# Patient Record
Sex: Male | Born: 1970 | Race: White | Hispanic: No | Smoking: Current some day smoker
Health system: Southern US, Community
[De-identification: ages and names within clinical notes are randomized; demographics above are authoritative.]

## PROBLEM LIST (undated history)

## (undated) DIAGNOSIS — R7989 Other specified abnormal findings of blood chemistry: Secondary | ICD-10-CM

## (undated) DIAGNOSIS — M549 Dorsalgia, unspecified: Secondary | ICD-10-CM

## (undated) DIAGNOSIS — I10 Essential (primary) hypertension: Secondary | ICD-10-CM

## (undated) DIAGNOSIS — F1123 Opioid dependence with withdrawal: Secondary | ICD-10-CM

## (undated) DIAGNOSIS — N200 Calculus of kidney: Secondary | ICD-10-CM

## (undated) DIAGNOSIS — F1193 Opioid use, unspecified with withdrawal: Secondary | ICD-10-CM

## (undated) DIAGNOSIS — S8290XA Unspecified fracture of unspecified lower leg, initial encounter for closed fracture: Secondary | ICD-10-CM

## (undated) DIAGNOSIS — F1120 Opioid dependence, uncomplicated: Secondary | ICD-10-CM

## (undated) HISTORY — DX: Other specified abnormal findings of blood chemistry: R79.89

## (undated) HISTORY — DX: Unspecified fracture of unspecified lower leg, initial encounter for closed fracture: S82.90XA

## (undated) HISTORY — DX: Opioid dependence with withdrawal: F11.23

## (undated) HISTORY — DX: Calculus of kidney: N20.0

## (undated) HISTORY — DX: Opioid dependence, uncomplicated: F11.20

## (undated) HISTORY — DX: Dorsalgia, unspecified: M54.9

## (undated) HISTORY — DX: Opioid use, unspecified with withdrawal: F11.93

## (undated) HISTORY — PX: HERNIA REPAIR: SHX51

---

## 2004-07-03 ENCOUNTER — Emergency Department: Payer: Self-pay | Admitting: Emergency Medicine

## 2006-04-07 ENCOUNTER — Emergency Department: Payer: Self-pay | Admitting: Emergency Medicine

## 2007-09-05 ENCOUNTER — Other Ambulatory Visit: Payer: Self-pay

## 2007-09-05 ENCOUNTER — Emergency Department: Payer: Self-pay | Admitting: Emergency Medicine

## 2007-10-06 ENCOUNTER — Ambulatory Visit: Payer: Self-pay | Admitting: Urology

## 2008-01-11 ENCOUNTER — Ambulatory Visit: Payer: Self-pay | Admitting: Gastroenterology

## 2008-02-02 ENCOUNTER — Ambulatory Visit: Payer: Self-pay | Admitting: Urology

## 2008-02-27 ENCOUNTER — Emergency Department: Payer: Self-pay | Admitting: Emergency Medicine

## 2008-03-07 ENCOUNTER — Encounter: Payer: Self-pay | Admitting: Family Medicine

## 2008-03-17 ENCOUNTER — Encounter: Payer: Self-pay | Admitting: Family Medicine

## 2008-04-11 ENCOUNTER — Ambulatory Visit: Payer: Self-pay | Admitting: Family Medicine

## 2008-07-14 ENCOUNTER — Emergency Department: Payer: Self-pay | Admitting: Unknown Physician Specialty

## 2008-10-06 ENCOUNTER — Emergency Department: Payer: Self-pay | Admitting: Unknown Physician Specialty

## 2008-10-08 ENCOUNTER — Emergency Department: Payer: Self-pay | Admitting: Emergency Medicine

## 2009-09-05 ENCOUNTER — Emergency Department: Payer: Self-pay | Admitting: Emergency Medicine

## 2009-11-13 ENCOUNTER — Ambulatory Visit: Payer: Self-pay | Admitting: Family Medicine

## 2010-03-05 ENCOUNTER — Emergency Department: Payer: Self-pay | Admitting: Unknown Physician Specialty

## 2011-09-25 ENCOUNTER — Emergency Department: Payer: Self-pay | Admitting: Emergency Medicine

## 2011-10-06 ENCOUNTER — Emergency Department: Payer: Self-pay | Admitting: Emergency Medicine

## 2011-10-06 LAB — CBC
MCH: 26.9 pg (ref 26.0–34.0)
Platelet: 263 10*3/uL (ref 150–440)
RBC: 4.86 10*6/uL (ref 4.40–5.90)
WBC: 8.9 10*3/uL (ref 3.8–10.6)

## 2011-10-06 LAB — URINALYSIS, COMPLETE
Bilirubin,UR: NEGATIVE
Glucose,UR: NEGATIVE mg/dL (ref 0–75)
Hyaline Cast: 1
Protein: NEGATIVE
Specific Gravity: 1.01 (ref 1.003–1.030)
WBC UR: 5 /HPF (ref 0–5)

## 2011-10-06 LAB — BASIC METABOLIC PANEL
Calcium, Total: 9.6 mg/dL (ref 8.5–10.1)
Chloride: 101 mmol/L (ref 98–107)
Co2: 28 mmol/L (ref 21–32)
Creatinine: 1.14 mg/dL (ref 0.60–1.30)
Sodium: 137 mmol/L (ref 136–145)

## 2011-10-25 ENCOUNTER — Emergency Department: Payer: Self-pay | Admitting: *Deleted

## 2011-10-25 LAB — COMPREHENSIVE METABOLIC PANEL
Albumin: 4.8 g/dL (ref 3.4–5.0)
Alkaline Phosphatase: 49 U/L — ABNORMAL LOW (ref 50–136)
Anion Gap: 10 (ref 7–16)
Glucose: 106 mg/dL — ABNORMAL HIGH (ref 65–99)
Potassium: 4 mmol/L (ref 3.5–5.1)
SGOT(AST): 21 U/L (ref 15–37)
SGPT (ALT): 21 U/L
Total Protein: 8.7 g/dL — ABNORMAL HIGH (ref 6.4–8.2)

## 2011-10-25 LAB — LIPASE, BLOOD: Lipase: 168 U/L (ref 73–393)

## 2011-10-25 LAB — URINALYSIS, COMPLETE
Bacteria: NONE SEEN
Blood: NEGATIVE
Ketone: NEGATIVE
Protein: NEGATIVE
Specific Gravity: 1.002 (ref 1.003–1.030)
WBC UR: NONE SEEN /HPF (ref 0–5)

## 2011-10-25 LAB — CBC
HCT: 43.8 % (ref 40.0–52.0)
HGB: 14.4 g/dL (ref 13.0–18.0)
RBC: 5.31 10*6/uL (ref 4.40–5.90)
WBC: 6.3 10*3/uL (ref 3.8–10.6)

## 2011-11-07 ENCOUNTER — Other Ambulatory Visit: Payer: Self-pay

## 2011-11-07 ENCOUNTER — Emergency Department (HOSPITAL_COMMUNITY): Payer: Self-pay

## 2011-11-07 ENCOUNTER — Emergency Department (HOSPITAL_COMMUNITY): Payer: No Typology Code available for payment source

## 2011-11-07 ENCOUNTER — Encounter (HOSPITAL_COMMUNITY): Payer: Self-pay | Admitting: *Deleted

## 2011-11-07 ENCOUNTER — Emergency Department (HOSPITAL_COMMUNITY): Payer: No Typology Code available for payment source | Admitting: Anesthesiology

## 2011-11-07 ENCOUNTER — Encounter (HOSPITAL_COMMUNITY): Payer: Self-pay | Admitting: Anesthesiology

## 2011-11-07 ENCOUNTER — Encounter (HOSPITAL_COMMUNITY)
Admission: EM | Disposition: A | Discharge status: Home / Self Care | Payer: Self-pay | Source: Home / Self Care | Attending: Orthopedic Surgery

## 2011-11-07 ENCOUNTER — Inpatient Hospital Stay (HOSPITAL_COMMUNITY)
Admission: EM | Admit: 2011-11-07 | Discharge: 2011-11-10 | DRG: 482 | Disposition: A | Discharge status: Home / Self Care | Payer: No Typology Code available for payment source | Attending: Orthopedic Surgery | Admitting: Orthopedic Surgery

## 2011-11-07 DIAGNOSIS — I10 Essential (primary) hypertension: Secondary | ICD-10-CM | POA: Diagnosis present

## 2011-11-07 DIAGNOSIS — S72309A Unspecified fracture of shaft of unspecified femur, initial encounter for closed fracture: Principal | ICD-10-CM | POA: Diagnosis present

## 2011-11-07 DIAGNOSIS — G8918 Other acute postprocedural pain: Secondary | ICD-10-CM | POA: Diagnosis not present

## 2011-11-07 DIAGNOSIS — S7290XA Unspecified fracture of unspecified femur, initial encounter for closed fracture: Secondary | ICD-10-CM

## 2011-11-07 DIAGNOSIS — K219 Gastro-esophageal reflux disease without esophagitis: Secondary | ICD-10-CM | POA: Diagnosis present

## 2011-11-07 DIAGNOSIS — Z01812 Encounter for preprocedural laboratory examination: Secondary | ICD-10-CM

## 2011-11-07 HISTORY — PX: FEMUR IM NAIL: SHX1597

## 2011-11-07 LAB — URINALYSIS, MICROSCOPIC ONLY
Ketones, ur: NEGATIVE mg/dL
Leukocytes, UA: NEGATIVE
Nitrite: NEGATIVE
Protein, ur: NEGATIVE mg/dL

## 2011-11-07 LAB — COMPREHENSIVE METABOLIC PANEL
ALT: 23 U/L (ref 0–53)
AST: 28 U/L (ref 0–37)
CO2: 25 mEq/L (ref 19–32)
Chloride: 102 mEq/L (ref 96–112)
Creatinine, Ser: 0.74 mg/dL (ref 0.50–1.35)
GFR calc Af Amer: >90 mL/min (ref >90)
GFR calc non Af Amer: >90 mL/min (ref >90)
Glucose, Bld: 99 mg/dL (ref 70–99)
Total Bilirubin: 0.2 mg/dL — ABNORMAL LOW (ref 0.3–1.2)

## 2011-11-07 LAB — POCT I-STAT, CHEM 8
Chloride: 106 mEq/L (ref 96–112)
Creatinine, Ser: 1.1 mg/dL (ref 0.50–1.35)
Glucose, Bld: 99 mg/dL (ref 70–99)
Hemoglobin: 15 g/dL (ref 13.0–17.0)
Potassium: 3.6 mEq/L (ref 3.5–5.1)

## 2011-11-07 LAB — CBC
MCH: 27.5 pg (ref 26.0–34.0)
MCHC: 33.3 g/dL (ref 30.0–36.0)
Platelets: 220 10*3/uL (ref 150–400)
RBC: 4.98 MIL/uL (ref 4.22–5.81)

## 2011-11-07 LAB — TYPE AND SCREEN: ABO/RH(D): B NEG

## 2011-11-07 LAB — ABO/RH: ABO/RH(D): B NEG

## 2011-11-07 LAB — RAPID URINE DRUG SCREEN, HOSP PERFORMED
Amphetamines: NOT DETECTED
Barbiturates: NOT DETECTED
Benzodiazepines: NOT DETECTED

## 2011-11-07 LAB — ETHANOL: Alcohol, Ethyl (B): 165 mg/dL — ABNORMAL HIGH (ref 0–11)

## 2011-11-07 SURGERY — INSERTION, INTRAMEDULLARY ROD, FEMUR
Anesthesia: General | Site: Thigh | Laterality: Left | Wound class: Clean

## 2011-11-07 MED ORDER — DEXTROSE 5 % IV SOLN
INTRAVENOUS | Status: DC | PRN
  Administered 2011-11-07: 05:00:00 via INTRAVENOUS

## 2011-11-07 MED ORDER — MENTHOL 3 MG MT LOZG
1.0000 | LOZENGE | OROMUCOSAL | Status: DC | PRN
  Filled 2011-11-07: qty 9

## 2011-11-07 MED ORDER — PHENOL 1.4 % MT LIQD: 1.0000 | OROMUCOSAL | Status: DC | PRN

## 2011-11-07 MED ORDER — QUINAPRIL HCL 10 MG PO TABS: 10.0000 mg | ORAL_TABLET | Freq: Two times a day (BID) | ORAL | Status: DC

## 2011-11-07 MED ORDER — METOCLOPRAMIDE HCL 10 MG PO TABS: 5.0000 mg | ORAL_TABLET | Freq: Three times a day (TID) | ORAL | Status: DC | PRN

## 2011-11-07 MED ORDER — 0.9 % SODIUM CHLORIDE (POUR BTL) OPTIME
TOPICAL | Status: DC | PRN
  Administered 2011-11-07: 1000 mL

## 2011-11-07 MED ORDER — ONDANSETRON HCL 4 MG/2ML IJ SOLN
4.0000 mg | Freq: Once | INTRAMUSCULAR | Status: AC
  Administered 2011-11-07: 4 mg via INTRAVENOUS
  Filled 2011-11-07: qty 2

## 2011-11-07 MED ORDER — CEFAZOLIN SODIUM 1-5 GM-% IV SOLN
INTRAVENOUS | Status: DC | PRN
  Administered 2011-11-07: 2 g via INTRAVENOUS

## 2011-11-07 MED ORDER — OXYCODONE HCL 5 MG PO TABS
10.0000 mg | ORAL_TABLET | ORAL | Status: DC | PRN
  Administered 2011-11-07: 10 mg via ORAL
  Filled 2011-11-07: qty 2

## 2011-11-07 MED ORDER — ACETAMINOPHEN 10 MG/ML IV SOLN
1000.0000 mg | Freq: Four times a day (QID) | INTRAVENOUS | Status: AC
  Administered 2011-11-07 – 2011-11-08 (×4): 1000 mg via INTRAVENOUS
  Filled 2011-11-07 (×4): qty 100

## 2011-11-07 MED ORDER — ONDANSETRON HCL 4 MG/2ML IJ SOLN: 4.0000 mg | Freq: Four times a day (QID) | INTRAMUSCULAR | Status: DC | PRN

## 2011-11-07 MED ORDER — DIPHENHYDRAMINE HCL 50 MG/ML IJ SOLN
12.5000 mg | Freq: Four times a day (QID) | INTRAMUSCULAR | Status: DC | PRN
  Administered 2011-11-07: 12.5 mg via INTRAVENOUS
  Filled 2011-11-07: qty 1

## 2011-11-07 MED ORDER — LISINOPRIL 10 MG PO TABS
10.0000 mg | ORAL_TABLET | Freq: Every day | ORAL | Status: DC
  Administered 2011-11-07 – 2011-11-09 (×3): 10 mg via ORAL
  Filled 2011-11-07 (×4): qty 1

## 2011-11-07 MED ORDER — CEFAZOLIN SODIUM 1-5 GM-% IV SOLN
INTRAVENOUS | Status: AC
  Filled 2011-11-07: qty 100

## 2011-11-07 MED ORDER — LACTATED RINGERS IV SOLN
INTRAVENOUS | Status: DC | PRN
  Administered 2011-11-07: 07:00:00 via INTRAVENOUS

## 2011-11-07 MED ORDER — SODIUM CHLORIDE 0.9 % IV SOLN
INTRAVENOUS | Status: DC | PRN
  Administered 2011-11-07: 05:00:00 via INTRAVENOUS

## 2011-11-07 MED ORDER — HYDROMORPHONE 0.3 MG/ML IV SOLN
INTRAVENOUS | Status: DC
  Administered 2011-11-07: 12:00:00 via INTRAVENOUS
  Administered 2011-11-07: 3.59 mg via INTRAVENOUS
  Administered 2011-11-07: 2.99 mg via INTRAVENOUS
  Administered 2011-11-08: 0.999 mg via INTRAVENOUS
  Administered 2011-11-08: 1.99 mg via INTRAVENOUS
  Administered 2011-11-08: 1.33 mg via INTRAVENOUS
  Administered 2011-11-08: 3.39 mg via INTRAVENOUS
  Administered 2011-11-08: 2.99 mg via INTRAVENOUS
  Administered 2011-11-08 – 2011-11-09 (×2): 3.59 mg via INTRAVENOUS
  Administered 2011-11-09: 3.19 mg via INTRAVENOUS

## 2011-11-07 MED ORDER — ENOXAPARIN SODIUM 40 MG/0.4ML ~~LOC~~ SOLN
40.0000 mg | SUBCUTANEOUS | Status: DC
  Administered 2011-11-08 – 2011-11-10 (×3): 40 mg via SUBCUTANEOUS
  Filled 2011-11-07 (×4): qty 0.4

## 2011-11-07 MED ORDER — PROPOFOL 10 MG/ML IV EMUL
INTRAVENOUS | Status: DC | PRN
  Administered 2011-11-07: 200 mg via INTRAVENOUS

## 2011-11-07 MED ORDER — OXYCODONE HCL 5 MG PO TABS
10.0000 mg | ORAL_TABLET | ORAL | Status: DC | PRN
  Administered 2011-11-07 – 2011-11-10 (×17): 10 mg via ORAL
  Filled 2011-11-07 (×17): qty 2

## 2011-11-07 MED ORDER — LISINOPRIL 20 MG PO TABS
20.0000 mg | ORAL_TABLET | Freq: Every day | ORAL | Status: DC
  Administered 2011-11-08 – 2011-11-10 (×3): 20 mg via ORAL
  Filled 2011-11-07 (×3): qty 1

## 2011-11-07 MED ORDER — HYDROMORPHONE 0.3 MG/ML IV SOLN
INTRAVENOUS | Status: AC
  Filled 2011-11-07: qty 25

## 2011-11-07 MED ORDER — FENTANYL CITRATE 0.05 MG/ML IJ SOLN
INTRAMUSCULAR | Status: AC
  Filled 2011-11-07: qty 2

## 2011-11-07 MED ORDER — ONDANSETRON HCL 4 MG/2ML IJ SOLN
4.0000 mg | Freq: Four times a day (QID) | INTRAMUSCULAR | Status: DC | PRN
Start: 2011-11-07 — End: 2011-11-07

## 2011-11-07 MED ORDER — NEOSTIGMINE METHYLSULFATE 1 MG/ML IJ SOLN
INTRAMUSCULAR | Status: DC | PRN
  Administered 2011-11-07: 3 mg via INTRAVENOUS

## 2011-11-07 MED ORDER — HYDROMORPHONE HCL PF 1 MG/ML IJ SOLN
INTRAMUSCULAR | Status: AC
  Filled 2011-11-07: qty 1

## 2011-11-07 MED ORDER — HYDROMORPHONE HCL PF 1 MG/ML IJ SOLN
1.0000 mg | Freq: Once | INTRAMUSCULAR | Status: AC
  Administered 2011-11-07: 1 mg via INTRAVENOUS

## 2011-11-07 MED ORDER — FENTANYL CITRATE 0.05 MG/ML IJ SOLN
INTRAMUSCULAR | Status: DC | PRN
  Administered 2011-11-07: 100 ug via INTRAVENOUS
  Administered 2011-11-07: 150 ug via INTRAVENOUS

## 2011-11-07 MED ORDER — NALOXONE HCL 0.4 MG/ML IJ SOLN: 0.4000 mg | INTRAMUSCULAR | Status: DC | PRN

## 2011-11-07 MED ORDER — LACTATED RINGERS IV SOLN
INTRAVENOUS | Status: DC
  Administered 2011-11-07 – 2011-11-09 (×3): via INTRAVENOUS

## 2011-11-07 MED ORDER — PANTOPRAZOLE SODIUM 40 MG PO TBEC
80.0000 mg | DELAYED_RELEASE_TABLET | Freq: Every day | ORAL | Status: DC
  Administered 2011-11-07 – 2011-11-10 (×3): 80 mg via ORAL
  Filled 2011-11-07 (×2): qty 2
  Filled 2011-11-07: qty 1

## 2011-11-07 MED ORDER — CEFAZOLIN SODIUM 1-5 GM-% IV SOLN
1.0000 g | Freq: Four times a day (QID) | INTRAVENOUS | Status: AC
  Administered 2011-11-07 (×3): 1 g via INTRAVENOUS
  Filled 2011-11-07 (×3): qty 50

## 2011-11-07 MED ORDER — MORPHINE SULFATE 2 MG/ML IJ SOLN
2.0000 mg | INTRAMUSCULAR | Status: DC | PRN
  Administered 2011-11-07: 2 mg via INTRAVENOUS
  Filled 2011-11-07: qty 1

## 2011-11-07 MED ORDER — TETANUS-DIPHTHERIA TOXOIDS TD 5-2 LFU IM INJ
0.5000 mL | INJECTION | Freq: Once | INTRAMUSCULAR | Status: AC
  Administered 2011-11-07: 0.5 mL via INTRAMUSCULAR
  Filled 2011-11-07: qty 0.5

## 2011-11-07 MED ORDER — METHOCARBAMOL 100 MG/ML IJ SOLN
500.0000 mg | Freq: Four times a day (QID) | INTRAVENOUS | Status: DC | PRN
  Filled 2011-11-07 (×2): qty 5

## 2011-11-07 MED ORDER — SUCCINYLCHOLINE CHLORIDE 20 MG/ML IJ SOLN
INTRAMUSCULAR | Status: DC | PRN
  Administered 2011-11-07: 100 mg via INTRAVENOUS

## 2011-11-07 MED ORDER — DOCUSATE SODIUM 100 MG PO CAPS
100.0000 mg | ORAL_CAPSULE | Freq: Two times a day (BID) | ORAL | Status: DC
  Administered 2011-11-07 – 2011-11-10 (×6): 100 mg via ORAL
  Filled 2011-11-07 (×8): qty 1

## 2011-11-07 MED ORDER — METOCLOPRAMIDE HCL 5 MG/ML IJ SOLN: 5.0000 mg | Freq: Three times a day (TID) | INTRAMUSCULAR | Status: DC | PRN

## 2011-11-07 MED ORDER — ONDANSETRON HCL 4 MG/2ML IJ SOLN
INTRAMUSCULAR | Status: DC | PRN
  Administered 2011-11-07: 4 mg via INTRAVENOUS

## 2011-11-07 MED ORDER — ONDANSETRON HCL 4 MG PO TABS: 4.0000 mg | ORAL_TABLET | Freq: Four times a day (QID) | ORAL | Status: DC | PRN

## 2011-11-07 MED ORDER — ROCURONIUM BROMIDE 100 MG/10ML IV SOLN
INTRAVENOUS | Status: DC | PRN
  Administered 2011-11-07: 40 mg via INTRAVENOUS

## 2011-11-07 MED ORDER — LISINOPRIL 10 MG PO TABS: 10.0000 mg | ORAL_TABLET | Freq: Every day | ORAL | Status: DC

## 2011-11-07 MED ORDER — CLONIDINE HCL 0.1 MG PO TABS
0.1000 mg | ORAL_TABLET | Freq: Two times a day (BID) | ORAL | Status: DC
  Administered 2011-11-07 – 2011-11-10 (×7): 0.1 mg via ORAL
  Filled 2011-11-07 (×8): qty 1

## 2011-11-07 MED ORDER — ACETAMINOPHEN 10 MG/ML IV SOLN
INTRAVENOUS | Status: AC
  Filled 2011-11-07: qty 100

## 2011-11-07 MED ORDER — HYDROMORPHONE HCL PF 1 MG/ML IJ SOLN
0.2500 mg | INTRAMUSCULAR | Status: DC | PRN
  Administered 2011-11-07 (×4): 0.5 mg via INTRAVENOUS

## 2011-11-07 MED ORDER — DIPHENHYDRAMINE HCL 12.5 MG/5ML PO ELIX
12.5000 mg | ORAL_SOLUTION | Freq: Four times a day (QID) | ORAL | Status: DC | PRN
  Administered 2011-11-09: 12.5 mg via ORAL
  Filled 2011-11-07 (×2): qty 10

## 2011-11-07 MED ORDER — GLYCOPYRROLATE 0.2 MG/ML IJ SOLN
INTRAMUSCULAR | Status: DC | PRN
  Administered 2011-11-07: 0.4 mg via INTRAVENOUS

## 2011-11-07 MED ORDER — FENTANYL CITRATE 0.05 MG/ML IJ SOLN
100.0000 ug | Freq: Once | INTRAMUSCULAR | Status: AC
  Administered 2011-11-07: 100 ug via INTRAVENOUS

## 2011-11-07 MED ORDER — ACETAMINOPHEN 10 MG/ML IV SOLN
INTRAVENOUS | Status: DC | PRN
  Administered 2011-11-07: 1000 mg via INTRAVENOUS

## 2011-11-07 MED ORDER — ONDANSETRON HCL 4 MG/2ML IJ SOLN
4.0000 mg | Freq: Four times a day (QID) | INTRAMUSCULAR | Status: DC | PRN
  Administered 2011-11-07: 4 mg via INTRAVENOUS
  Filled 2011-11-07: qty 2

## 2011-11-07 MED ORDER — BUPIVACAINE HCL (PF) 0.25 % IJ SOLN
INTRAMUSCULAR | Status: DC | PRN
  Administered 2011-11-07: 10 mL

## 2011-11-07 MED ORDER — METHOCARBAMOL 500 MG PO TABS
500.0000 mg | ORAL_TABLET | Freq: Four times a day (QID) | ORAL | Status: DC | PRN
  Administered 2011-11-07 – 2011-11-10 (×11): 500 mg via ORAL
  Filled 2011-11-07 (×12): qty 1

## 2011-11-07 MED ORDER — IOHEXOL 300 MG/ML  SOLN
100.0000 mL | Freq: Once | INTRAMUSCULAR | Status: AC | PRN
  Administered 2011-11-07: 100 mL via INTRAVENOUS

## 2011-11-07 MED ORDER — ALUM & MAG HYDROXIDE-SIMETH 200-200-20 MG/5ML PO SUSP: 30.0000 mL | ORAL | Status: DC | PRN

## 2011-11-07 MED ORDER — LORAZEPAM 2 MG/ML IJ SOLN
1.0000 mg | Freq: Four times a day (QID) | INTRAMUSCULAR | Status: DC | PRN
  Administered 2011-11-07 – 2011-11-09 (×6): 1 mg via INTRAVENOUS
  Filled 2011-11-07 (×6): qty 1

## 2011-11-07 MED ORDER — EPHEDRINE SULFATE 50 MG/ML IJ SOLN
INTRAMUSCULAR | Status: DC | PRN
  Administered 2011-11-07: 5 mg via INTRAVENOUS
  Administered 2011-11-07: 10 mg via INTRAVENOUS

## 2011-11-07 MED ORDER — SODIUM CHLORIDE 0.9 % IJ SOLN: 9.0000 mL | INTRAMUSCULAR | Status: DC | PRN

## 2011-11-07 SURGICAL SUPPLY — 36 items
BIT DRILL 3.8X6 NS (BIT) ×2 IMPLANT
BIT DRILL 5.3 NS (BIT) ×2 IMPLANT
CLOTH BEACON ORANGE TIMEOUT ST (SAFETY) ×2 IMPLANT
COVER SURGICAL LIGHT HANDLE (MISCELLANEOUS) ×2 IMPLANT
DRAPE STERI IOBAN 125X83 (DRAPES) ×2 IMPLANT
DRAPE U-SHAPE 47X51 STRL (DRAPES) ×4 IMPLANT
DRSG MEPILEX BORDER 4X4 (GAUZE/BANDAGES/DRESSINGS) ×2 IMPLANT
DRSG MEPILEX BORDER 4X8 (GAUZE/BANDAGES/DRESSINGS) ×2 IMPLANT
ELECT REM PT RETURN 9FT ADLT (ELECTROSURGICAL) ×2
ELECTRODE REM PT RTRN 9FT ADLT (ELECTROSURGICAL) ×1 IMPLANT
GLOVE BIOGEL PI IND STRL 6.5 (GLOVE) ×1 IMPLANT
GLOVE BIOGEL PI IND STRL 8.5 (GLOVE) ×1 IMPLANT
GLOVE BIOGEL PI INDICATOR 6.5 (GLOVE) ×1
GLOVE BIOGEL PI INDICATOR 8.5 (GLOVE) ×1
GLOVE ECLIPSE 6.0 STRL STRAW (GLOVE) ×2 IMPLANT
GLOVE ECLIPSE 8.5 STRL (GLOVE) ×2 IMPLANT
GOWN PREVENTION PLUS XXLARGE (GOWN DISPOSABLE) ×2 IMPLANT
GOWN STRL NON-REIN LRG LVL3 (GOWN DISPOSABLE) ×4 IMPLANT
GUIDEPIN 3.2X17.5 THRD DISP (PIN) ×2 IMPLANT
GUIDEWIRE BALL NOSE 80CM (WIRE) ×2 IMPLANT
KIT BASIN OR (CUSTOM PROCEDURE TRAY) ×2 IMPLANT
KIT ROOM TURNOVER OR (KITS) ×2 IMPLANT
MANIFOLD NEPTUNE II (INSTRUMENTS) ×2 IMPLANT
NAIL FEMORAL 11X36CM (Nail) ×2 IMPLANT
NS IRRIG 1000ML POUR BTL (IV SOLUTION) ×2 IMPLANT
PACK GENERAL/GYN (CUSTOM PROCEDURE TRAY) ×2 IMPLANT
PAD ARMBOARD 7.5X6 YLW CONV (MISCELLANEOUS) ×4 IMPLANT
PIN GUIDE ACE (PIN) ×2 IMPLANT
SCREW ACE CORTICAL (Screw) ×1 IMPLANT
SCREW ACECAP 40MM (Screw) ×2 IMPLANT
SCREW BN FT 65X6.5XST DRV (Screw) ×1 IMPLANT
SCREWDRIVER HEX TIP 3.5MM (MISCELLANEOUS) ×2 IMPLANT
SOLIDLOK HEX TIP ×2 IMPLANT
STAPLER VISISTAT 35W (STAPLE) IMPLANT
SUT VIC AB 2-0 CTB1 (SUTURE) IMPLANT
WATER STERILE IRR 1000ML POUR (IV SOLUTION) ×4 IMPLANT
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
## 2011-11-07 NOTE — ED Notes (Signed)
Xray at bedside. Using urinal. Pain medicine administered per order. Pt remains alert and interactive
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
## 2011-11-07 NOTE — ED Notes (Signed)
Patient is resting comfortably. 

## 2011-11-07 NOTE — Anesthesia Preprocedure Evaluation (Addendum)
Anesthesia Evaluation  Patient identified by MRN, date of birth, ID band Patient awake    Reviewed: Allergy & Precautions, H&P , NPO status , Patient's Chart, lab work & pertinent test results, reviewed documented beta blocker date and time   Airway Mallampati: II TM Distance: >3 FB Neck ROM: Full    Dental  (+) Teeth Intact and Dental Advisory Given,    Pulmonary    Pulmonary exam normal       Cardiovascular hypertension, negative cardio ROS  Rhythm:Regular Rate:Normal     Neuro/Psych    GI/Hepatic Neg liver ROS, GERD-  Medicated,  Endo/Other  negative endocrine ROS  Renal/GU negative Renal ROS  negative genitourinary   Musculoskeletal   Abdominal   Peds  Hematology   Anesthesia Other Findings   Reproductive/Obstetrics                          Anesthesia Physical Anesthesia Plan  ASA: II and Emergent  Anesthesia Plan: General   Post-op Pain Management:    Induction: Intravenous, Rapid sequence and Cricoid pressure planned  Airway Management Planned: Oral ETT  Additional Equipment:   Intra-op Plan:   Post-operative Plan: Extubation in OR  Informed Consent: I have reviewed the patients History and Physical, chart, labs and discussed the procedure including the risks, benefits and alternatives for the proposed anesthesia with the patient or authorized representative who has indicated his/her understanding and acceptance.   Dental advisory given  Plan Discussed with: Anesthesiologist and Surgeon  Anesthesia Plan Comments:         Anesthesia Quick Evaluation
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
## 2011-11-07 NOTE — Progress Notes (Signed)
Orthopedic Tech Progress Note Patient Details:  Carlos Rubio Oct 15, 1970 [id number]    Trapeze bar  Cammer, Mickie Bail 11/07/2011, 3:17 PM

## 2011-11-07 NOTE — ED Notes (Signed)
Pt c/o left leg pain. Positive left leg deformity. Peripheral pulses present Denies chest pain/sob or stomach pain. Reports etoh intake of 1/2 pint liquor.

## 2011-11-07 NOTE — Progress Notes (Signed)
Pt complain of 10/10 pain this morning after all PRN meds were given.  MD notified.  Will start Reduced dose dilaudid PCA per MD order.  Will admin Ativan 1mg  Q6prn per order.  Pt reports taking over 20 mg of Percocet daily for chronic pain.  Will continue to monitor.

## 2011-11-07 NOTE — ED Notes (Signed)
Pt cleared from backboard at this time by dr rancour.

## 2011-11-07 NOTE — Transfer of Care (Signed)
Immediate Anesthesia Transfer of Care Note  Patient: Carlos Rubio  Procedure(s) Performed: Procedure(s) (LRB): INTRAMEDULLARY (IM) NAIL FEMORAL (Left)  Patient Location: PACU  Anesthesia Type: General  Level of Consciousness: awake, alert  and oriented  Airway & Oxygen Therapy: Patient Spontanous Breathing and Patient connected to nasal cannula oxygen  Post-op Assessment: Report given to PACU RN, Post -op Vital signs reviewed and stable and Patient moving all extremities  Post vital signs: Reviewed and stable  Complications: No apparent anesthesia complications

## 2011-11-07 NOTE — ED Notes (Signed)
100 mcg fentanyl given per verbal order dr. Manus Gunning.

## 2011-11-07 NOTE — Anesthesia Procedure Notes (Signed)
Procedure Name: Intubation Date/Time: 11/07/2011 5:07 AM Performed by: Molli Hazard Pre-anesthesia Checklist: Patient identified, Emergency Drugs available, Suction available and Patient being monitored Patient Re-evaluated:Patient Re-evaluated prior to inductionOxygen Delivery Method: Circle system utilized Preoxygenation: Pre-oxygenation with 100% oxygen Intubation Type: IV induction, Rapid sequence and Cricoid Pressure applied Laryngoscope Size: Miller and 2 Grade View: Grade I Tube type: Oral Tube size: 7.5 mm Number of attempts: 1 Airway Equipment and Method: Stylet Placement Confirmation: ETT inserted through vocal cords under direct vision,  positive ETCO2 and breath sounds checked- equal and bilateral Secured at: 23 cm Tube secured with: Tape Dental Injury: Teeth and Oropharynx as per pre-operative assessment

## 2011-11-07 NOTE — Brief Op Note (Signed)
11/07/2011  6:40 AM  PATIENT:  Carlos Rubio  41 y.o. male  PRE-OPERATIVE DIAGNOSIS:  Left Midshaft Femur Fracture  POST-OPERATIVE DIAGNOSIS:  Left Midshaft Femur Fracture  PROCEDURE:  Procedure(s) (LRB): INTRAMEDULLARY (IM) NAIL FEMORAL (Left)  SURGEON:  Surgeon(s) and Role:    * Venita Lick, MD - Primary  PHYSICIAN ASSISTANT:   ASSISTANTS: Norval Gable   ANESTHESIA:   general  EBL:  Total I/O In: 300 [I.V.:300] Out: 700 [Urine:550; Blood:150]  BLOOD ADMINISTERED:none  DRAINS: none   LOCAL MEDICATIONS USED:  MARCAINE     SPECIMEN:  No Specimen  DISPOSITION OF SPECIMEN:  N/A  COUNTS:  YES  TOURNIQUET:  * No tourniquets in log *  DICTATION: .Other Dictation: Dictation Number [id number]  PLAN OF CARE: Admit to inpatient   PATIENT DISPOSITION:  PACU - hemodynamically stable.

## 2011-11-07 NOTE — ED Notes (Signed)
Patient in xray 

## 2011-11-07 NOTE — Preoperative (Signed)
Beta Blockers   Reason not to administer Beta Blockers:Not Applicable 

## 2011-11-07 NOTE — ED Notes (Signed)
Patient transported to CT 
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
## 2011-11-07 NOTE — ED Notes (Signed)
Ortho at bedside cory newsome

## 2011-11-07 NOTE — ED Notes (Signed)
X-ray at bedside

## 2011-11-07 NOTE — ED Notes (Signed)
18 gauge IV placed right AC. Labs drawn.

## 2011-11-07 NOTE — ED Notes (Signed)
Pt alert and interactive GCS 15. Airway intact. Dr Manus Gunning at bedside for eval.

## 2011-11-08 MED ORDER — HYDROMORPHONE HCL 2 MG PO TABS
2.0000 mg | ORAL_TABLET | ORAL | Status: DC | PRN
  Administered 2011-11-08: 2 mg via ORAL
  Filled 2011-11-08: qty 1

## 2011-11-08 MED ORDER — HYDROMORPHONE 0.3 MG/ML IV SOLN
INTRAVENOUS | Status: AC
  Administered 2011-11-08: 10:00:00
  Filled 2011-11-08: qty 25

## 2011-11-08 MED ORDER — HYDROMORPHONE 0.3 MG/ML IV SOLN
INTRAVENOUS | Status: AC
  Administered 2011-11-08: 19:00:00
  Filled 2011-11-08: qty 25
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
## 2011-11-08 NOTE — Progress Notes (Signed)
Orthopedics Progress Note  Subjective: Pt resting c/o moderate pain femur today. Pain medications not giving enough relief  Objective:  Filed Vitals:   11/08/11 0640  BP: 120/80  Pulse: 106  Temp: 98.3 F (36.8 C)  Resp: 20    General: Awake and alert  Musculoskeletal: left leg incision healing well, nv intact distally Neurovascularly intact  Lab Results  Component Value Date   WBC 10.3 11/07/2011   HGB 15.0 11/07/2011   HCT 44.0 11/07/2011   MCV 82.5 11/07/2011   PLT 220 11/07/2011       Component Value Date/Time   NA 145 11/07/2011 0056   K 3.6 11/07/2011 0056   CL 106 11/07/2011 0056   CO2 25 11/07/2011 0041   GLUCOSE 99 11/07/2011 0056   BUN 7 11/07/2011 0056   CREATININE 1.10 11/07/2011 0056   CALCIUM 9.5 11/07/2011 0041   GFRNONAA >90 11/07/2011 0041   GFRAA >90 11/07/2011 0041    Lab Results  Component Value Date   INR 1.00 11/07/2011    Assessment/Plan: POD #2 s/p Procedure(s): left INTRAMEDULLARY (IM) NAIL FEMORAL  Change pain medication today to dilaudid PT/OT D/c planning  Almedia Balls. Ranell Patrick, MD 11/08/2011 8:58 AM

## 2011-11-08 NOTE — Progress Notes (Signed)
Called MD to verify new pain med order.  MD recommends trying the PO Dilaudid instead of the Oxycodone to see if it manages patient's pain better.  MD doesn't want to d/c Oxy just in case patient needs it.  Ok to give both po pain meds with PCA only if patient remains in severe pain.  Continuous pulse oximetry/CO2 monitoring in place.  Will continue to monitor.

## 2011-11-08 NOTE — Progress Notes (Signed)
OT Cancellation Note  Treatment cancelled today due to patient's refusal to participate due to pain and fatigue. Pt agreeable to therapy tomorrow. Will re-attempt.  11/08/2011 Cipriano Mile OTR/L Pager [phone] Office [phone]

## 2011-11-09 MED ORDER — OXYCODONE-ACETAMINOPHEN 10-325 MG PO TABS: 1.0000 | ORAL_TABLET | ORAL | Status: DC | PRN

## 2011-11-09 MED ORDER — METHOCARBAMOL 500 MG PO TABS: 500.0000 mg | ORAL_TABLET | Freq: Three times a day (TID) | ORAL | Status: DC

## 2011-11-09 MED ORDER — ENOXAPARIN SODIUM 40 MG/0.4ML ~~LOC~~ SOLN: 40.0000 mg | SUBCUTANEOUS | Status: DC

## 2011-11-09 MED ORDER — HYDROMORPHONE 0.3 MG/ML IV SOLN
INTRAVENOUS | Status: AC
  Administered 2011-11-09: 2.33 mg via INTRAVENOUS
  Filled 2011-11-09: qty 25

## 2011-11-09 MED ORDER — ONDANSETRON HCL 4 MG PO TABS: 4.0000 mg | ORAL_TABLET | Freq: Three times a day (TID) | ORAL | Status: DC | PRN

## 2011-11-09 MED ORDER — POLYETHYLENE GLYCOL 3350 17 G PO PACK: 17.0000 g | PACK | Freq: Every day | ORAL | Status: DC
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
## 2011-11-09 NOTE — Progress Notes (Signed)
Physical Therapy Treatment Patient Details Name: Carlos Rubio MRN: [id number] DOB: 1971/03/23 Today's Date: 11/09/2011  PT Assessment/Plan  PT - Assessment/Plan Comments on Treatment Session: Continued anxiety with moving during session, but still making good, solid improvements with functional mobility; On track for dc home tomorrow PT Plan: Discharge plan remains appropriate PT Frequency: Min 6X/week Recommendations for Other Services: OT consult Follow Up Recommendations: Home health PT;Supervision - Intermittent Equipment Recommended: Rolling walker with 5" wheels;3 in 1 bedside comode PT Goals  Acute Rehab PT Goals Time For Goal Achievement: 7 days Pt will go Sit to Stand: with modified independence PT Goal: Sit to Stand - Progress: Progressing toward goal Pt will go Stand to Sit: with modified independence PT Goal: Stand to Sit - Progress: Progressing toward goal Pt will Ambulate: >150 feet;with modified independence;with rolling walker PT Goal: Ambulate - Progress: Progressing toward goal Pt will Go Up / Down Stairs: 3-5 stairs;with modified independence;with rail(s) PT Goal: Up/Down Stairs - Progress: Progressing toward goal  PT Treatment Precautions/Restrictions  Restrictions Weight Bearing Restrictions: Yes LLE Weight Bearing: Partial weight bearing LLE Partial Weight Bearing Percentage or Pounds: 25-50% Mobility (including Balance) Transfers Transfers: Yes Sit to Stand: 4: Min assist;From chair/3-in-1;With armrests;With upper extremity assist (Guard assist) Sit to Stand Details (indicate cue type and reason): Slow movign, but requiring less assist; cues for technique, safety, hand placement Stand to Sit: 4: Min assist;With upper extremity assist;With armrests;To chair/3-in-1 Stand to Sit Details: Slow moving, but requiring less physical assist; cues for technique Ambulation/Gait Ambulation/Gait Assistance: 4: Min assist (Guard assist) Ambulation/Gait Assistance Details  (indicate cue type and reason): continued cues fro gait sequence; good PWB, and improving distance Ambulation Distance (Feet): 45 Feet Assistive device: Rolling walker Gait Pattern: Step-to pattern;Antalgic Stairs: Yes Stairs Assistance: 4: Min assist Stairs Assistance Details (indicate cue type and reason): verbal and demo cues for ascend/descend steps backwards with RW; Overall, pt managed well Stair Management Technique: No rails;Backwards;With walker Number of Stairs: 2  (only did 2 for initial practice)      End of Session PT - End of Session Equipment Utilized During Treatment: Gait belt Activity Tolerance: Patient limited by pain Patient left: in chair;with call bell in reach Nurse Communication: Mobility status for transfers;Mobility status for ambulation General Behavior During Session: [facility] for tasks performed Cognition: [facility] for tasks performed  [facility] [place], [place] [phone]  11/09/2011, 12:07 PM
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
## 2011-11-09 NOTE — Progress Notes (Signed)
Occupational Therapy Treatment Patient Details Name: Carlos Rubio MRN: [id number] DOB: 31-Jan-1971 Today's Date: 11/09/2011  OT Assessment/Plan OT Assessment/Plan Comments on Treatment Session: will benefit from another ADL sessin in am to reinforce use of AE for ADL OT Frequency: Min 2X/week Follow Up Recommendations: No OT follow up Equipment Recommended: Rolling walker with 5" wheels;3 in 1 bedside comode;Tub/shower bench OT Goals Acute Rehab OT Goals OT Goal Formulation: With patient Time For Goal Achievement: 7 days ADL Goals Pt Will Perform Lower Body Bathing: with modified independence;Sit to stand from chair;Unsupported;with adaptive equipment ADL Goal: Lower Body Bathing - Progress: Progressing toward goals Pt Will Perform Lower Body Dressing: with modified independence;Sit to stand from chair;Unsupported;with adaptive equipment ADL Goal: Lower Body Dressing - Progress: Progressing toward goals Pt Will Transfer to Toilet: with modified independence;with DME;3-in-1;Maintaining weight bearing status ADL Goal: Toilet Transfer - Progress: Progressing toward goals Pt Will Perform Toileting - Clothing Manipulation: with modified independence;Standing ADL Goal: Toileting - Clothing Manipulation - Progress: Progressing toward goals  OT Treatment Precautions/Restrictions  Precautions Precautions: Other (comment) (WBS) Restrictions Weight Bearing Restrictions: Yes LLE Weight Bearing: Partial weight bearing LLE Partial Weight Bearing Percentage or Pounds: 25-50% Sensation Light Touch: Appears Intact ADL ADL Eating/Feeding: Performed;Independent Where Assessed - Eating/Feeding: Chair Grooming: Simulated;Set up Where Assessed - Grooming: Sitting, chair Upper Body Bathing: Simulated;Set up Where Assessed - Upper Body Bathing: Sitting, chair Lower Body Bathing: Performed;Minimal assistance;Other (comment) (using AE) Where Assessed - Lower Body Bathing: Sit to stand from chair Upper  Body Dressing: Simulated;Set up Where Assessed - Upper Body Dressing: Sitting, chair Lower Body Dressing: Performed;Minimal assistance;Other (comment) (using AE) Where Assessed - Lower Body Dressing: Sit to stand from chair Toilet Transfer: Performed;Minimal assistance Toilet Transfer Method: Ambulating Toilet Transfer Equipment: Bedside commode;Grab bars Toileting - Clothing Manipulation: Performed;Modified independent Where Assessed - Toileting Clothing Manipulation: Standing Toileting - Hygiene: Independent Where Assessed - Toileting Hygiene: Standing Tub/Shower Transfer: Not assessed;Other (comment) (discussed need for shower chair or bench) Ambulation Related to ADLs: supervision with encouragement ADL Comments: Discussed need for 3 in 1. Pt plans on sponge bathing if cannot get a tub bench.  Mobility  Bed Mobility Bed Mobility: No Transfers Sit to Stand: 4: Min assist;From chair/3-in-1;With armrests;With upper extremity assist (Guard assist) Sit to Stand Details (indicate cue type and reason): Slow movign, but requiring less assist; cues for technique, safety, hand placement Stand to Sit: 4: Min assist;With upper extremity assist;With armrests;To chair/3-in-1 Stand to Sit Details: Slow moving, but requiring less physical assist; cues for technique Exercises    End of Session General Behavior During Session: [facility] for tasks performed Cognition: [facility] for tasks performed [facility], OTR/L  [phone] 11/09/2011 Xian Alves,HILLARY  11/09/2011, 3:51 PM
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
## 2011-11-10 ENCOUNTER — Encounter (HOSPITAL_COMMUNITY): Payer: Self-pay | Admitting: Orthopedic Surgery

## 2011-11-10 MED ORDER — OXYCODONE-ACETAMINOPHEN 10-325 MG PO TABS: 1.0000 | ORAL_TABLET | ORAL | Status: AC | PRN

## 2011-11-10 MED ORDER — ONDANSETRON HCL 4 MG PO TABS: 4.0000 mg | ORAL_TABLET | Freq: Three times a day (TID) | ORAL | Status: AC | PRN

## 2011-11-10 MED ORDER — ENOXAPARIN SODIUM 40 MG/0.4ML ~~LOC~~ SOLN: 40.0000 mg | SUBCUTANEOUS | Status: DC

## 2011-11-10 MED ORDER — METHOCARBAMOL 500 MG PO TABS: 500.0000 mg | ORAL_TABLET | Freq: Three times a day (TID) | ORAL | Status: DC

## 2011-11-10 MED ORDER — METHOCARBAMOL 500 MG PO TABS: 500.0000 mg | ORAL_TABLET | Freq: Three times a day (TID) | ORAL | Status: AC

## 2011-11-10 MED ORDER — OXYCODONE-ACETAMINOPHEN 10-325 MG PO TABS: 1.0000 | ORAL_TABLET | ORAL | Status: DC | PRN

## 2011-11-10 MED ORDER — ONDANSETRON HCL 4 MG PO TABS: 4.0000 mg | ORAL_TABLET | Freq: Three times a day (TID) | ORAL | Status: DC | PRN

## 2011-11-10 MED ORDER — POLYETHYLENE GLYCOL 3350 17 G PO PACK: 17.0000 g | PACK | Freq: Every day | ORAL | Status: AC

## 2011-11-10 NOTE — Progress Notes (Signed)
Occupational Therapy Treatment Patient Details Name: Carlos Rubio MRN: [id number] DOB: 1970-10-05 Today's Date: 11/10/2011  OT Assessment/Plan OT Assessment/Plan Equipment Recommended: Rolling walker with 5" wheels;3 in 1 bedside comode;Tub/shower bench OT Goals ADL Goals Pt Will Perform Lower Body Bathing: with modified independence;Sit to stand from chair;Unsupported;with adaptive equipment ADL Goal: Lower Body Bathing - Progress: Met Pt Will Perform Lower Body Dressing: with modified independence;Sit to stand from chair;Unsupported;with adaptive equipment ADL Goal: Lower Body Dressing - Progress: Met Pt Will Transfer to Toilet: with modified independence;with DME;3-in-1;Maintaining weight bearing status ADL Goal: Toilet Transfer - Progress: Partly met Pt Will Perform Toileting - Clothing Manipulation: with modified independence;Standing ADL Goal: Toileting - Clothing Manipulation - Progress: Met  OT Treatment Precautions/Restrictions  Restrictions Weight Bearing Restrictions: Yes LLE Weight Bearing: Partial weight bearing LLE Partial Weight Bearing Percentage or Pounds: 25-50%   ADL ADL Eating/Feeding: Performed;Independent Grooming: Simulated;Set up Where Assessed - Grooming: Sitting, bed;Unsupported Upper Body Bathing: Performed;Set up Where Assessed - Upper Body Bathing: Sitting, bed;Unsupported Lower Body Bathing: Performed;Set up Where Assessed - Lower Body Bathing: Sit to stand from bed;Unsupported Where Assessed - Upper Body Dressing: Sitting, bed;Unsupported Lower Body Dressing: Performed;Modified independent Where Assessed - Lower Body Dressing: Sit to stand from bed Ambulation Related to ADLs: mod I ADL Comments: mod I with AE Mobility  Transfers Sit to Stand: 5: Supervision;With armrests;From chair/3-in-1 Sit to Stand Details (indicate cue type and reason): Slow moving, but managing without physical assist Stand to Sit: 5: Supervision;With armrests;To  chair/3-in-1 Stand to Sit Details: Cues for control; slow moving, but managing without need for physical assist Exercises Total Joint Exercises Ankle Circles/Pumps: AROM;Left;10 reps (in recliner) Quad Sets: AROM;Left;10 reps Heel Slides: AAROM;Left;10 reps Hip ABduction/ADduction: AAROM;Left;10 reps  End of Session General Behavior During Session: [facility] for tasks performed Cognition: [facility] for tasks performed  [facility]  11/10/2011, 4:15 PM

## 2011-11-10 NOTE — Progress Notes (Signed)
CARE MANAGEMENT NOTE 11/10/2011  Patient:  BOLESLAUS, HOLLOWAY   Account Number:  [id number]  Date Initiated:  11/10/2011  Documentation initiated by:  Vance Peper  Subjective/Objective Assessment:     Action/Plan:   Patient  needs DME for home.   Anticipated DC Date:  11/10/2011   Anticipated DC Plan:  HOME/SELF CARE      DC Planning Services  CM consult      PAC Choice  DURABLE MEDICAL EQUIPMENT   Choice offered to / List presented to:     DME arranged  [facility]  3-N-1      DME agency  [facility]     HH arranged  NA      HH agency  NA   Status of service:  Completed, signed off Medicare Important Message given?   (If response is "NO", the following Medicare IM given date fields will be blank) Date Medicare IM given:   Date Additional Medicare IM given:    Discharge Disposition:  HOME  SELF CARE Per UR Regulation:    If discussed at Long Length of Stay Meetings, dates discussed:    Comments:

## 2011-11-10 NOTE — Progress Notes (Signed)
Physical Therapy Treatment Patient Details Name: Carlos Rubio MRN: [id number] DOB: 1971/04/30 Today's Date: 11/10/2011  PT Assessment/Plan  PT - Assessment/Plan Comments on Treatment Session: Ok for [employer] home today PT Plan: Discharge plan remains appropriate PT Frequency: Min 6X/week Follow Up Recommendations: Home health PT;Supervision - Intermittent Equipment Recommended: Rolling walker with 5" wheels;3 in 1 bedside comode;Tub/shower bench PT Goals  Acute Rehab PT Goals Time For Goal Achievement: 7 days Pt will go Sit to Stand: with modified independence PT Goal: Sit to Stand - Progress: Progressing toward goal Pt will go Stand to Sit: with modified independence PT Goal: Stand to Sit - Progress: Progressing toward goal Pt will Ambulate: >150 feet;with modified independence;with rolling walker PT Goal: Ambulate - Progress: Progressing toward goal Pt will Go Up / Down Stairs: 3-5 stairs;with modified independence;with rail(s) PT Goal: Up/Down Stairs - Progress: Progressing toward goal Pt will Perform Home Exercise Program: Independently PT Goal: Perform Home Exercise Program - Progress: Progressing toward goal  PT Treatment Precautions/Restrictions  Precautions Precautions: Other (comment) (WBS) Restrictions Weight Bearing Restrictions: Yes LLE Weight Bearing: Partial weight bearing LLE Partial Weight Bearing Percentage or Pounds: 25-50% Mobility (including Balance) Transfers Sit to Stand: 5: Supervision;With armrests;From chair/3-in-1 Sit to Stand Details (indicate cue type and reason): Slow moving, but managing without physical assist Stand to Sit: 5: Supervision;With armrests;To chair/3-in-1 Stand to Sit Details: Cues for control; slow moving, but managing without need for physical assist Ambulation/Gait Ambulation/Gait Assistance: 5: Supervision Ambulation/Gait Assistance Details (indicate cue type and reason): Managing PWB well; very slow moving, but steady Ambulation  Distance (Feet): 100 Feet Assistive device: Rolling walker Gait Pattern: Step-to pattern;Antalgic Stairs: Yes Stairs Assistance: 4: Min assist Stairs Assistance Details (indicate cue type and reason): verbal cues for technique; pt was able to manage steps safely, and was able to instruct person who is helping him in technique Stair Management Technique: No rails;Backwards;With walker Number of Stairs: 3  (twice)    Exercise  Total Joint Exercises Ankle Circles/Pumps: AROM;Left;10 reps (in recliner) Quad Sets: AROM;Left;10 reps Heel Slides: AAROM;Left;10 reps Hip ABduction/ADduction: AAROM;Left;10 reps End of Session PT - End of Session Equipment Utilized During Treatment: Gait belt Activity Tolerance: Patient limited by pain Patient left: in chair;with call bell in reach Nurse Communication: Mobility status for transfers;Mobility status for ambulation General Behavior During Session: [facility] for tasks performed Cognition: [facility] for tasks performed  Van Clines [facility] [place], [place] [phone]  11/10/2011, 1:06 PM

## 2011-11-10 NOTE — Progress Notes (Signed)
Carlos Rubio  41 y.o.  11/07/2011   Left Midshaft Femur Fracture  3 Days Post-Op   Subjective  Patient doing well overall; reports pain mostly with turning in bed and ambulating  Objective  Sensation intact; Compartments soft/NT  EHL/TA/EHL intact  Peripheral pulses symmetrical  Abdomen soft and NT Wound/dressings C/D/I    Assessment/ Plan  Patient: By patient report pain complicates recovery; h/o narcotic abuse    Plan:  Encourage ambulation - continue pt/ot until DC Continue PO pain meds Lovenox for d/c as DVT prevention followed by ASA.  will likely consider pain management on an outpatient basis  Disposition:  DC to home today follow-up in 2 weeks [facility] condition at DC: STABLE Norval Gable PA-C 11/10/11

## 2011-11-15 ENCOUNTER — Emergency Department: Payer: Self-pay | Admitting: Emergency Medicine

## 2011-11-15 LAB — CBC
HGB: 10.3 g/dL — ABNORMAL LOW (ref 13.0–18.0)
MCH: 28 pg (ref 26.0–34.0)
MCHC: 33.3 g/dL (ref 32.0–36.0)
MCV: 84 fL (ref 80–100)
Platelet: 399 10*3/uL (ref 150–440)
RDW: 16.1 % — ABNORMAL HIGH (ref 11.5–14.5)
WBC: 8.6 10*3/uL (ref 3.8–10.6)

## 2011-11-15 LAB — BASIC METABOLIC PANEL
Anion Gap: 10 (ref 7–16)
BUN: 19 mg/dL — ABNORMAL HIGH (ref 7–18)
Calcium, Total: 8.9 mg/dL (ref 8.5–10.1)
Chloride: 105 mmol/L (ref 98–107)
Co2: 27 mmol/L (ref 21–32)
Creatinine: 0.84 mg/dL (ref 0.60–1.30)
Potassium: 4.3 mmol/L (ref 3.5–5.1)

## 2011-11-16 LAB — PROTIME-INR
INR: 1.1
Prothrombin Time: 14.3 secs (ref 11.5–14.7)

## 2011-12-02 ENCOUNTER — Emergency Department: Payer: Self-pay | Admitting: Emergency Medicine

## 2012-06-11 ENCOUNTER — Emergency Department: Payer: Self-pay | Admitting: Internal Medicine

## 2012-06-11 LAB — URINALYSIS, COMPLETE
Bacteria: NONE SEEN
Bilirubin,UR: NEGATIVE
Glucose,UR: NEGATIVE mg/dL (ref 0–75)
Leukocyte Esterase: NEGATIVE
Nitrite: NEGATIVE
Specific Gravity: 1.019 (ref 1.003–1.030)
WBC UR: <1 /HPF (ref 0–5)

## 2012-06-22 ENCOUNTER — Ambulatory Visit: Payer: Self-pay | Admitting: Anesthesiology

## 2012-06-22 DIAGNOSIS — I10 Essential (primary) hypertension: Secondary | ICD-10-CM

## 2012-06-27 ENCOUNTER — Ambulatory Visit: Payer: Self-pay | Admitting: Surgery

## 2012-10-18 ENCOUNTER — Emergency Department: Payer: Self-pay | Admitting: Emergency Medicine

## 2013-01-28 IMAGING — US US EXTREM LOW VENOUS*L*
1 series · 17 of 21 positions shown · non-contrast
Comparison: none

REASON FOR EXAM: pain and swelling
COMMENTS:

PROCEDURE:     US  - US DOPPLER LOW EXTR LEFT  - November 15, 2011 [DATE]
RESULT:     The left femoral and popliteal veins are normally compressible.
The waveform patterns are normal and the color flow images are normal. The
response to the augmentation and Valsalva maneuvers is normal.

[Series 1: us extrem low venous*left* · 17 of 21 slices shown]
[im 1/21]
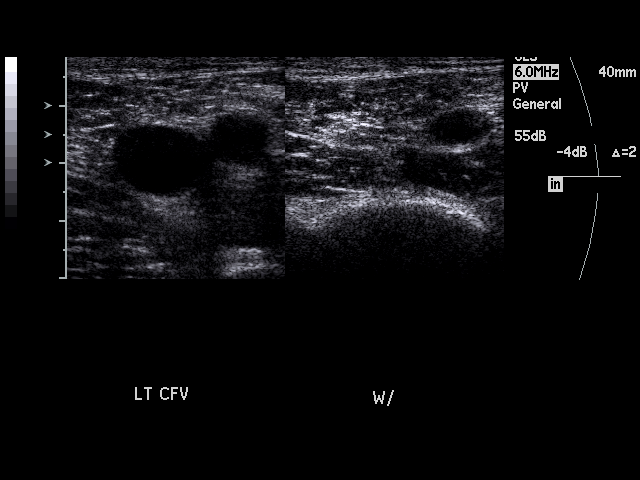
[im 2/21]
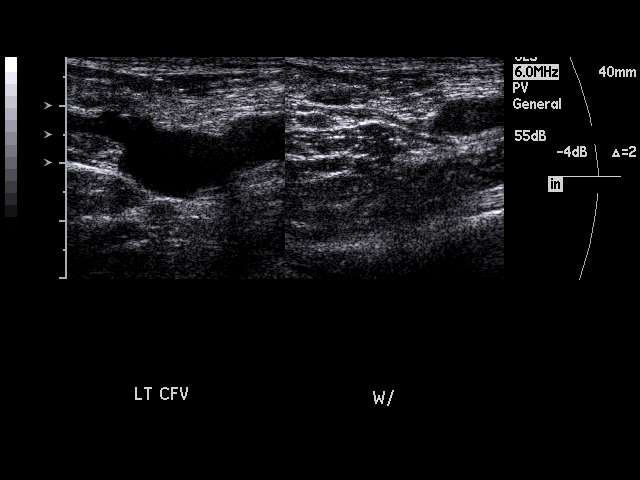
[im 4/21]
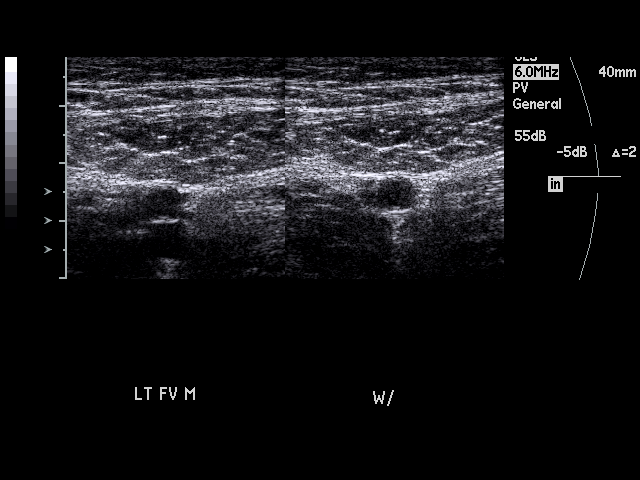
[im 5/21]
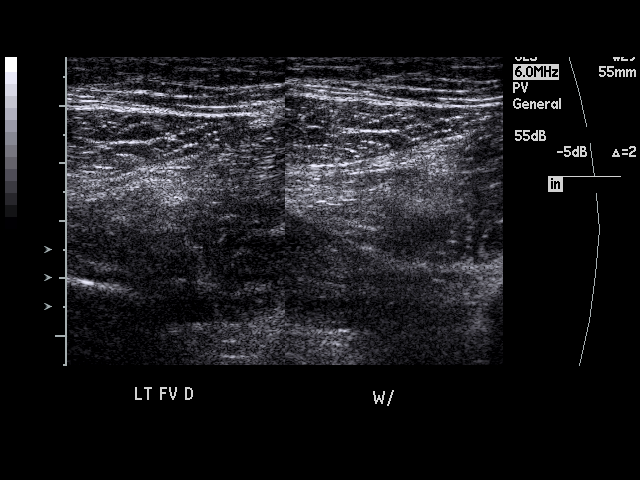
[im 6/21]
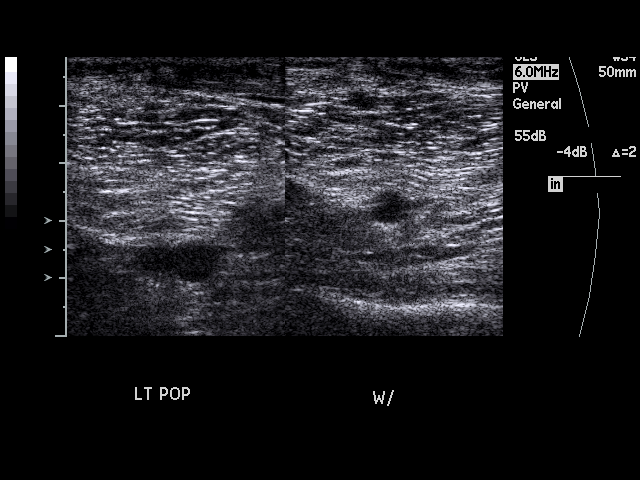
[im 7/21]
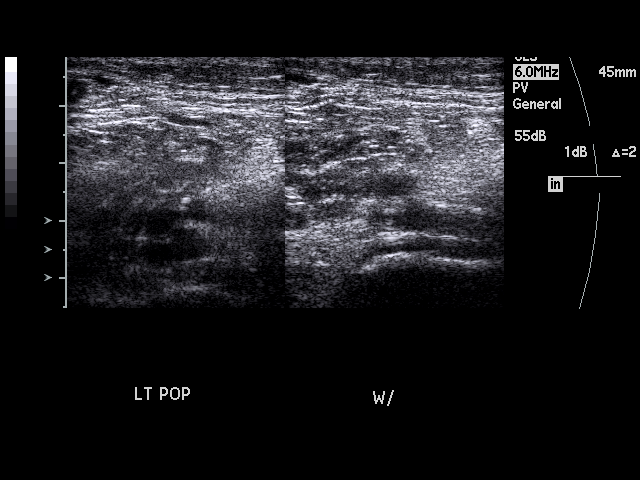
[im 9/21]
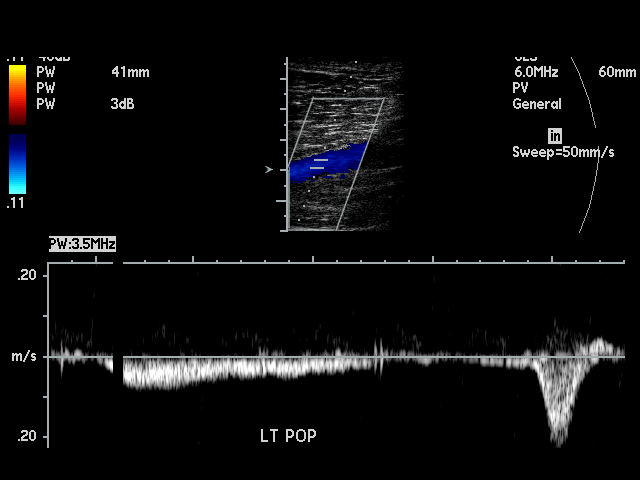
[im 10/21]
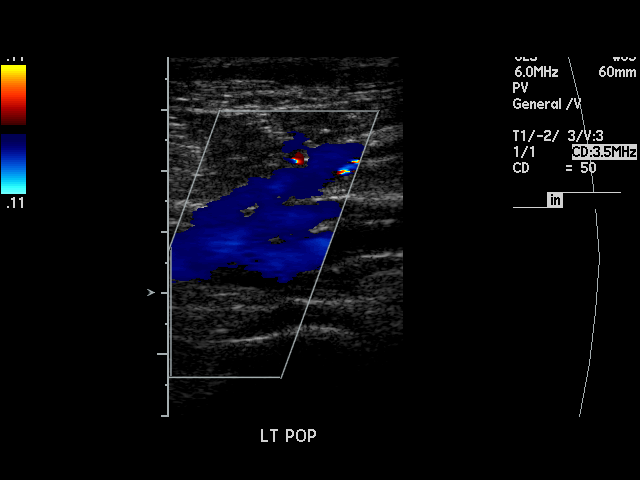
[im 11/21]
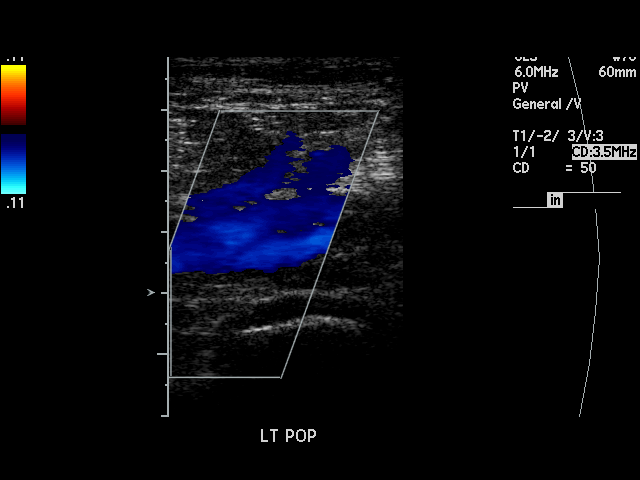
[im 12/21]
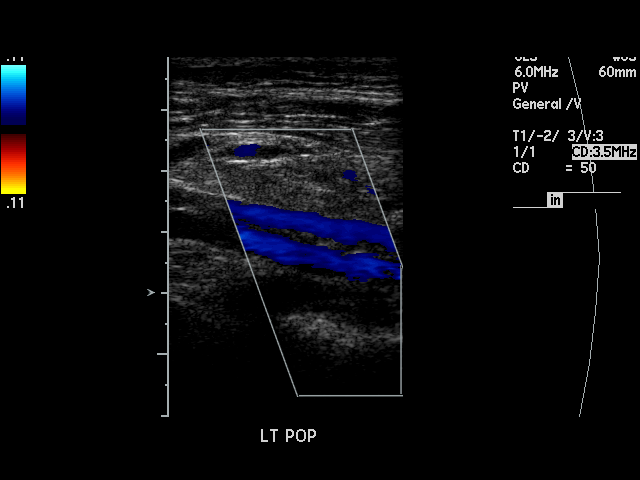
[im 13/21]
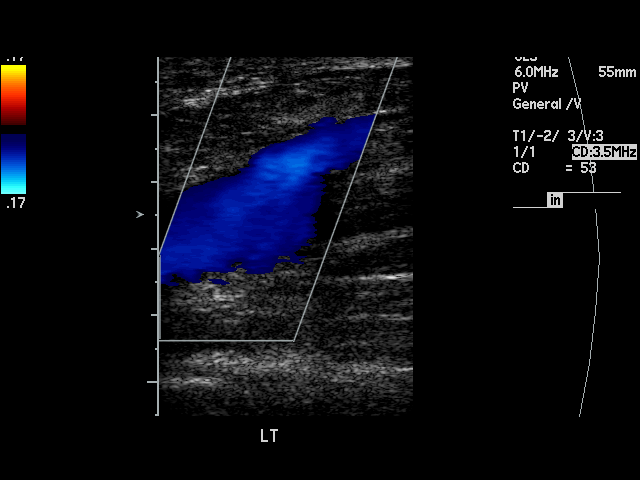
[im 15/21]
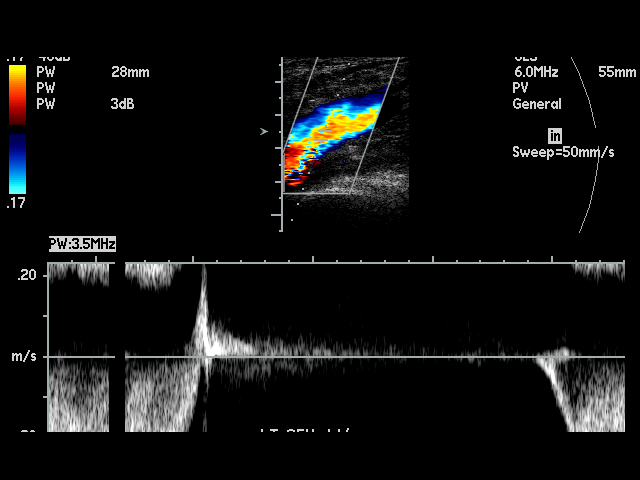
[im 16/21]
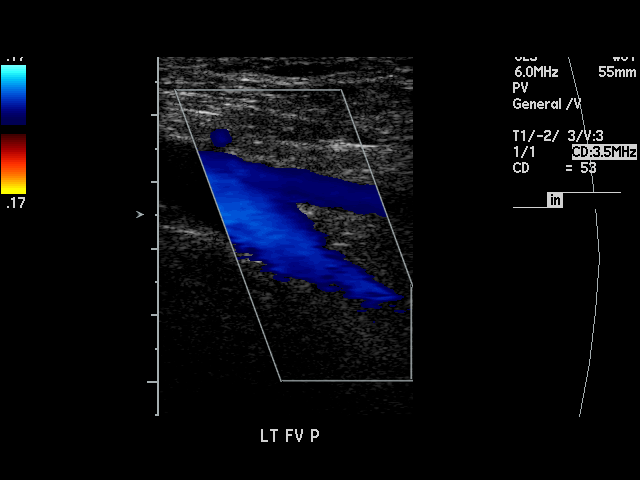
[im 17/21]
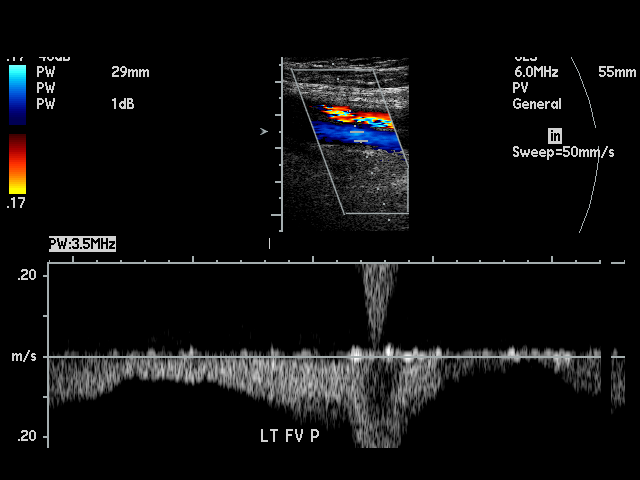
[im 18/21]
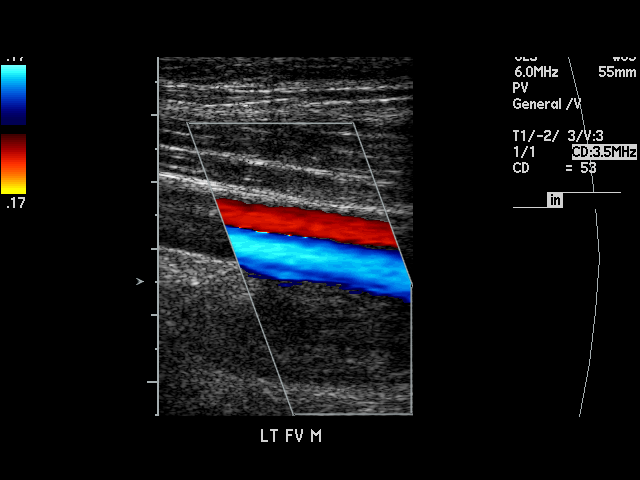
[im 20/21]
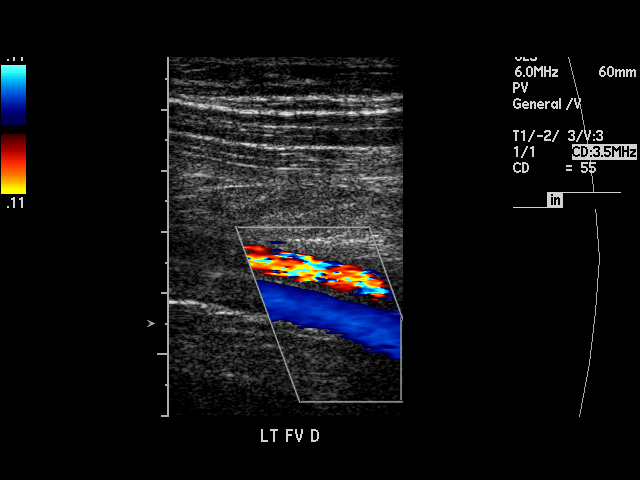
[im 21/21]
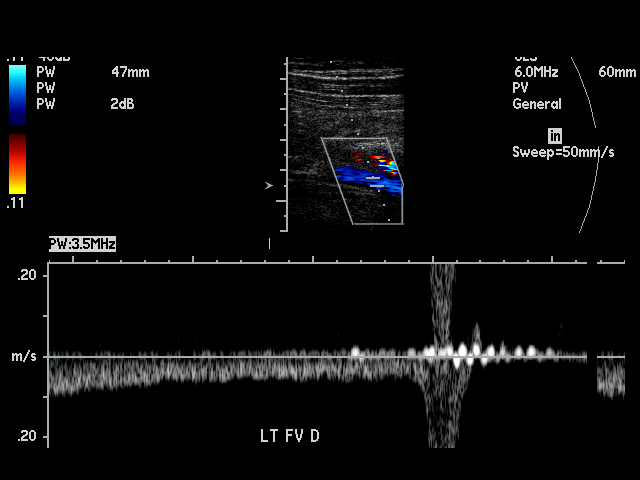

[17 of 21 positions shown; findings below may reference images not displayed]

IMPRESSION: I see no evidence of thrombus within the left femoral or
popliteal veins.

A preliminary report was sent to the [HOSPITAL] the conclusion
of the study.

## 2013-02-27 ENCOUNTER — Emergency Department: Payer: Self-pay | Admitting: Emergency Medicine

## 2013-04-16 ENCOUNTER — Emergency Department: Payer: Self-pay | Admitting: Emergency Medicine

## 2013-04-16 LAB — CBC
HCT: 39.6 % — ABNORMAL LOW (ref 40.0–52.0)
HGB: 13.5 g/dL (ref 13.0–18.0)
MCV: 83 fL (ref 80–100)
RDW: 14.9 % — ABNORMAL HIGH (ref 11.5–14.5)

## 2013-04-16 LAB — BASIC METABOLIC PANEL
Anion Gap: 5 — ABNORMAL LOW (ref 7–16)
BUN: 9 mg/dL (ref 7–18)
Chloride: 103 mmol/L (ref 98–107)
Co2: 29 mmol/L (ref 21–32)
Creatinine: 0.91 mg/dL (ref 0.60–1.30)
EGFR (Non-African Amer.): >60
Glucose: 88 mg/dL (ref 65–99)

## 2013-04-16 LAB — URINALYSIS, COMPLETE
Glucose,UR: 50 mg/dL (ref 0–75)
Ketone: NEGATIVE
Protein: 100
RBC,UR: 366 /HPF (ref 0–5)
Specific Gravity: 1.01 (ref 1.003–1.030)

## 2013-06-12 ENCOUNTER — Emergency Department: Payer: Self-pay

## 2013-06-12 LAB — URINALYSIS, COMPLETE
Bilirubin,UR: NEGATIVE
Glucose,UR: NEGATIVE mg/dL (ref 0–75)
Leukocyte Esterase: NEGATIVE
Nitrite: NEGATIVE
Protein: NEGATIVE
RBC,UR: NONE SEEN /HPF (ref 0–5)
Squamous Epithelial: <1
WBC UR: <1 /HPF (ref 0–5)

## 2013-06-12 LAB — COMPREHENSIVE METABOLIC PANEL
Albumin: 3.9 g/dL (ref 3.4–5.0)
Alkaline Phosphatase: 65 U/L (ref 50–136)
BUN: 13 mg/dL (ref 7–18)
Bilirubin,Total: 0.5 mg/dL (ref 0.2–1.0)
Calcium, Total: 9.3 mg/dL (ref 8.5–10.1)
Chloride: 105 mmol/L (ref 98–107)
EGFR (Non-African Amer.): >60
Potassium: 4 mmol/L (ref 3.5–5.1)
SGOT(AST): 34 U/L (ref 15–37)
SGPT (ALT): 38 U/L (ref 12–78)
Sodium: 137 mmol/L (ref 136–145)

## 2013-06-12 LAB — CBC
HCT: 41 % (ref 40.0–52.0)
MCH: 27.8 pg (ref 26.0–34.0)
Platelet: 186 10*3/uL (ref 150–440)
RDW: 13.8 % (ref 11.5–14.5)

## 2013-06-12 LAB — TROPONIN I: Troponin-I: <0.02 ng/mL

## 2013-10-08 ENCOUNTER — Emergency Department (HOSPITAL_COMMUNITY)
Admission: EM | Admit: 2013-10-08 | Discharge: 2013-10-08 | Disposition: A | Discharge status: Home / Self Care | Payer: Self-pay | Attending: Emergency Medicine | Admitting: Emergency Medicine

## 2013-10-08 ENCOUNTER — Encounter (HOSPITAL_COMMUNITY): Payer: Self-pay | Admitting: Emergency Medicine

## 2013-10-08 DIAGNOSIS — K0889 Other specified disorders of teeth and supporting structures: Secondary | ICD-10-CM

## 2013-10-08 DIAGNOSIS — K089 Disorder of teeth and supporting structures, unspecified: Secondary | ICD-10-CM | POA: Insufficient documentation

## 2013-10-08 DIAGNOSIS — K029 Dental caries, unspecified: Secondary | ICD-10-CM | POA: Insufficient documentation

## 2013-10-08 DIAGNOSIS — I10 Essential (primary) hypertension: Secondary | ICD-10-CM | POA: Insufficient documentation

## 2013-10-08 DIAGNOSIS — Z79899 Other long term (current) drug therapy: Secondary | ICD-10-CM | POA: Insufficient documentation

## 2013-10-08 HISTORY — DX: Essential (primary) hypertension: I10

## 2013-10-08 MED ORDER — NAPROXEN 500 MG PO TABS: 500.0000 mg | ORAL_TABLET | Freq: Two times a day (BID) | ORAL | Status: DC

## 2013-10-08 MED ORDER — PENICILLIN V POTASSIUM 500 MG PO TABS: 500.0000 mg | ORAL_TABLET | Freq: Four times a day (QID) | ORAL | Status: DC

## 2013-10-08 MED ORDER — HYDROCODONE-ACETAMINOPHEN 5-325 MG PO TABS: 1.0000 | ORAL_TABLET | Freq: Four times a day (QID) | ORAL | Status: DC | PRN

## 2013-10-08 MED ORDER — HYDROCODONE-ACETAMINOPHEN 5-325 MG PO TABS
2.0000 | ORAL_TABLET | Freq: Once | ORAL | Status: AC
  Administered 2013-10-08: 2 via ORAL
  Filled 2013-10-08: qty 2

## 2013-10-08 MED ORDER — LISINOPRIL 40 MG PO TABS: 40.0000 mg | ORAL_TABLET | Freq: Every day | ORAL | Status: DC

## 2013-10-08 NOTE — Discharge Instructions (Signed)
Take penicillin to cover for infection. Take naproxen for pain. You may take Norco as prescribed for breakthrough pain control. Followup with a dentist tomorrow.  Dental Pain Toothache is pain in or around a tooth. It may get worse with chewing or with cold or heat.  HOME CARE  Your dentist may use a numbing medicine during treatment. If so, you may need to avoid eating until the medicine wears off. Ask your dentist about this.  Only take medicine as told by your dentist or doctor.  Avoid chewing food near the painful tooth until after all treatment is done. Ask your dentist about this. GET HELP RIGHT AWAY IF:   The problem gets worse or new problems appear.  You have a fever.  There is redness and puffiness (swelling) of the face, jaw, or neck.  You cannot open your mouth.  There is pain in the jaw.  There is very bad pain that is not helped by medicine. MAKE SURE YOU:   Understand these instructions.  Will watch your condition.  Will get help right away if you are not doing well or get worse. Document Released: 01/20/2008 Document Revised: 10/26/2011 Document Reviewed: 01/20/2008 [facility] Patient Information 2014 [place], [place].  Emergency Department Resource Guide 1) Find a Doctor and Pay Out of Pocket Although you won't have to find out who is covered by your insurance plan, it is a good idea to ask around and get recommendations. You will then need to call the office and see if the doctor you have chosen will accept you as a new patient and what types of options they offer for patients who are self-pay. Some doctors offer discounts or will set up payment plans for their patients who do not have insurance, but you will need to ask so you aren't surprised when you get to your appointment.  2) Contact Your Local Health Department Not all health departments have doctors that can see patients for sick visits, but many do, so it is worth a call to see if yours does. If you  don't know where your local health department is, you can check in your phone book. The CDC also has a tool to help you locate your state's health department, and many state websites also have listings of all of their local health departments.  3) Find a Walk-in Clinic If your illness is not likely to be very severe or complicated, you may want to try a walk in clinic. These are popping up all over the country in pharmacies, drugstores, and shopping centers. They're usually staffed by nurse practitioners or physician assistants that have been trained to treat common illnesses and complaints. They're usually fairly quick and inexpensive. However, if you have serious medical issues or chronic medical problems, these are probably not your best option.  No Primary Care Doctor: - Call Health Connect at  [phone] - they can help you locate a primary care doctor that  accepts your insurance, provides certain services, etc. - Physician Referral Service- [phone]  Chronic Pain Problems: Organization         Address  Phone   Notes  [facility]  [phone] Patients need to be referred by their primary care doctor.   Medication Assistance: Organization         Address  Phone   Notes  [facility] [address] [place] [place], [place] [address] [phone] --Must be a resident of [facility] -- Must  have NO insurance coverage whatsoever (no Medicaid/ Medicare, etc.) -- The pt. MUST have a primary care doctor that directs their care regularly and follows them in the community   [facility]  [phone]   [employer]  [phone]    Agencies that provide inexpensive medical care: Organization         Address  Phone   Notes  [facility]  [phone]   [facility]    [phone]   [facility] [address] [place], [place] [address] [phone]   [facility]  [place] [address], [place] [phone]   Planned Parenthood    [phone]   [facility]    [phone]   [facility]  [address], [place] Phone:  [phone], Fax:  [phone] Hours of Operation:  9 am - 6 pm, M-F.  Also accepts Medicaid/Medicare and self-pay.  [facility]  [address], [place] Phone: [phone], Fax: [phone]. Hours of Operation:  8:30 am - 5:30 pm, M-F.  Also accepts Medicaid and self-pay.  [facility] [place] [address], [place] Phone: [phone]   [facility] [address], [place], [place] [phone], Ext. [phone] Mondays & Thursdays: 7-9 AM.  First 15 patients are seen on a first come, first serve basis.    Medicaid-accepting [facility] Providers:  Organization         Address  Phone   Notes  [facility] [address], [place] [phone] Also accepts self-pay patients.  [facility] [address] [place], [place]  [phone]   [facility] [address], [place] [phone]) [phone]   [facility] [address] [place] [address], [place] [phone]   Renaye RakersVeita Bland [address], [place]   [phone] Only accepts [place] Access [place] patients after they have their name applied to their card.   Self-Pay (no insurance) in [facility]:  Organization         Address  Phone   Notes  Sickle Cell Patients, [facility] [address], [place] [phone]   [facility] [address], [place] [phone]) [phone]   [facility] [place]  [address],  [phone]   [facility]/Dr. Osei-Bonsu  [address] [place] [address], [place] or [phone], [place] [phone] Phone  number for both [place] and [place] locations is the same.  [facility] [address], [place] [phone]   [facility] [place] [address] [place] [address], [place] or [address] [phone]   [facility] [address], [place] [phone], phone; [phone], fax Sees patients 1st and 3rd Saturday of every month.  Must not qualify for public or private insurance (i.e. Medicaid, Medicare, [place] Health Choice, Veterans' Benefits)  Household income should be no more than 200% of the poverty level The clinic cannot treat you if you are pregnant or think you are pregnant  Sexually transmitted diseases are not treated at the clinic.    Dental Care: Organization         Address  Phone  Notes  [facility] Department of [facility] [address] [place], [place] [phone] Accepts children up to age 55 who are enrolled in [place] or [place] Health Choice; pregnant women with a Medicaid card; and children who have applied for Medicaid or [place] Health Choice, but were declined, whose parents can pay a reduced fee at time of service.  [facility] Department of [facility]  [address], [place] [phone] Accepts children up to age 54 who are enrolled in [place] or [place] Health Choice; pregnant women with a Medicaid card; and children who have applied for Medicaid or [place] Health Choice, but were declined, whose parents can pay a reduced fee at time of service.  [facility]  [address] [place], [place] [phone] Patients are seen by appointment only. Walk-ins are not accepted. [facility] will see patients 76 years of age and older. Monday - Tuesday (8am-5pm) Most Wednesdays (8:30-5pm) $30 per visit, cash only  [facility]  [address], [facility] [phone] Patients are seen by appointment only.  Walk-ins are not accepted. [facility] will see patients 29 years of age and older. One Wednesday Evening (Monthly: Volunteer Based).  $30 per visit, cash only  [employer]  [phone] for adults; Children under age 63, call Graduate Pediatric Dentistry at [phone]. Children aged 18-14, please call [phone] to request a pediatric application.  Dental services are provided in all areas of dental care including fillings, crowns and bridges, complete and partial dentures, implants, gum treatment, root canals, and extractions. Preventive care is also provided. Treatment is provided to both adults and children. Patients are selected via a lottery and there is often a waiting list.   [facility] [address] [place]  [phone] [email or web address]   [facility] [address] [place], [place] [phone], Ext. [phone] Second and Fourth Thursday of each month, opens at 6:30 AM; Clinic ends at 9 AM.  Patients are seen on a first-come first-served basis, and a limited number are seen during each clinic.   [facility]  [address] [place], [place] [phone]   Eligibility Requirements You must have lived in [place], [place], or [place] counties for at least the last three months.   You cannot be eligible for state or federal sponsored [place], including [employer], [place], or [employer].   You generally cannot be eligible for healthcare insurance through your employer.    How to apply: Eligibility screenings are held every Tuesday and Wednesday afternoon from 1:00 pm until 4:00 pm. You do not need an appointment for the interview!  [facility] [address], [place], [place] [phone]   [facility] Health Department  [phone]   [facility] Health Department  [phone]   [facility] Health Department  [phone]    Behavioral Health  Resources in the Community: Intensive Outpatient Programs Organization         Address  Phone  Notes  [facility] [address] [place], [place] [phone]   [facility] [address], [place], [place] [phone]   [facility] [address] [place], [place]  [phone]   [facility] Mental Health [address],  [place], [place] [phone] or [phone]   Substance Abuse Resources Organization  Address  Phone  Notes  [facility] Drug [facility]  [phone]   [facility]  [phone]   [facility] [place]  [phone]   Floydene Flock  [phone]   Residential & Outpatient Substance Abuse Program  [phone]   Psychological Services Organization         Address  Phone  Notes  [facility]  [phone]   [facility]  [phone]   [facility] Mental Health [address] [place] [phone] or [phone]    Mobile Crisis Teams Organization         Address  Phone  Notes  [facility], Mobile Crisis Care Unit  [phone]   [facility]  [address] [place], [place] [phone]   Doristine Locks [address] [place] [phone]    Self-Help/Support Groups Organization         Address  Phone             Notes  [facility]oc[facility] of [place] - variety of support groups  [phone]- I7437963 Call for more information  Narcotics Anonymous (NA), [facility] [address], [employer] [place]  2 meetings at this location   Statistician         Address  Phone  Notes  [facility] [address],    [place]  [phone]   [facility]  [address] [place] [address] [place] [address], [place], [place] [phone]   [facility] [address] [place], [place] [phone] Admissions: 8am-3pm M-F  [facility]  [facility] [address]    [place], [place] [phone]   [facility] [address] [place], [place], [place] [phone]   [facility] [address]  [place], [place] [phone]   [facility] - Intensive Outpatient [address], [place], [place] [phone]   [facility] (Addiction [facility] [address] [place]  [place], [place] [phone] or [phone]   [facility] ([facility]) [address] [place], [place] [phone] Accepts Medicaid  [facility] [address]  [place] [phone] Substance Abuse/Addiction Treatment   [facility] Organization         Address  Phone  Notes  [facility]  [phone]   Angie Fava, PhD [address] [place], [place]   [phone] or [phone]   [facility] Behavioral   [address] [place], [place] [phone]   [facility] [address] [place], [place] [phone] Insurance/Medicaid/sponsorship through [facility] Families [address]                                    [place], [place] [phone] Therapy/tele-psych/case  [facility] [address], [place] [phone]    Dr. Lolly Mustache  [phone]   [facility]  [employer] [facility] Dept. 1) [address] [place] 2) [address] [place] 3)  [address], [place] [phone]  [phone]   [facility] Child Abuse Hotline (626)531-9097 or [phone] (After Hours)

## 2013-10-08 NOTE — ED Provider Notes (Signed)
CSN: [id number]     Arrival date & time 10/08/13  1240 History  This chart was scribed for non-physician practitioner, Antony Madura, PA-C working with Junius Argyle, MD by Greggory Stallion, ED scribe. This patient was seen in room TR06C/TR06C and the patient's care was started at 12:57 PM.    Chief Complaint  Patient presents with  . Dental Pain   The history is provided by the patient. No language interpreter was used.   HPI Comments: Carlos Rubio is a 43 y.o. male who presents to the Emergency Department complaining of gradual onset, constant, throbbing right upper dental pain that started 2 days ago. Pt states his tooth broke off one week ago. He has taken tylenol and ibuprofen and used a BC powder on the tooth with little relief. Denies fever, ear discharge, mouth lesions, mouth bleeding, inability to swallow, drooling. Pt ran out of his blood pressure medication yesterday and is requesting a refill. Denies chest pain, SOB.   Past Medical History  Diagnosis Date  . Hypertension    Past Surgical History  Procedure Laterality Date  . Femur im nail  11/07/2011    Procedure: INTRAMEDULLARY (IM) NAIL FEMORAL;  Surgeon: Venita Lick, MD;  Location: [facility] OR;  Service: Orthopedics;  Laterality: Left;   No family history on file. History  Substance Use Topics  . Smoking status: Never Smoker   . Smokeless tobacco: Not on file  . Alcohol Use: Yes     Comment: Socially    Review of Systems  Constitutional: Negative for fever.  HENT: Positive for dental problem. Negative for drooling, ear discharge and trouble swallowing.   Respiratory: Negative for shortness of breath.   Cardiovascular: Negative for chest pain.  All other systems reviewed and are negative.   Allergies  Review of patient's allergies indicates no known allergies.  Home Medications   Current Outpatient Rx  Name  Route  Sig  Dispense  Refill  . cloNIDine (CATAPRES) 0.1 MG tablet   Oral   Take 0.1 mg by mouth 2 (two)  times daily.         Marland Kitchen enoxaparin (LOVENOX) 40 MG/0.4ML injection   Subcutaneous   Inject 0.4 mLs (40 mg total) into the skin daily. For 10 (ten) days   10 Syringe   0   . HYDROcodone-acetaminophen (NORCO/VICODIN) 5-325 MG per tablet   Oral   Take 1-2 tablets by mouth every 6 (six) hours as needed.   7 tablet   0   . lisinopril (PRINIVIL,ZESTRIL) 40 MG tablet   Oral   Take 1 tablet (40 mg total) by mouth daily.   30 tablet   1   . naproxen (NAPROSYN) 500 MG tablet   Oral   Take 1 tablet (500 mg total) by mouth 2 (two) times daily.   30 tablet   0   . omeprazole (PRILOSEC) 40 MG capsule   Oral   Take 40 mg by mouth daily.         . penicillin v potassium (VEETID) 500 MG tablet   Oral   Take 1 tablet (500 mg total) by mouth 4 (four) times daily.   40 tablet   0   . quinapril (ACCUPRIL) 20 MG tablet   Oral   Take 10-20 mg by mouth 2 (two) times daily. Takes 1 tab in the morning and takes 1/2 tab in the afternoon         . testosterone cypionate (DEPOTESTOTERONE CYPIONATE) 100 MG/ML injection  Intramuscular   Inject 350 mg into the muscle every 14 (fourteen) days. For IM use only Every other Wednesday          BP 143/96  Pulse 105  Temp(Src) 98 F (36.7 C) (Oral)  Resp 20  SpO2 95%  Physical Exam  Nursing note and vitals reviewed. Constitutional: He is oriented to person, place, and time. He appears well-developed and well-nourished. No distress.  HENT:  Head: Normocephalic and atraumatic.  Right Ear: Hearing, tympanic membrane, external ear and ear canal normal. No mastoid tenderness.  Left Ear: Hearing, tympanic membrane, external ear and ear canal normal. No mastoid tenderness.  Nose: Nose normal.  Mouth/Throat: Uvula is midline, oropharynx is clear and moist and mucous membranes are normal. No oral lesions. No trismus in the jaw. Abnormal dentition. Dental caries present. No uvula swelling. No tonsillar abscesses.    Uvula midline without  evidence of peritonsillar abscess. Patient has a dental fracture to his right upper wisdom tooth with associated tenderness to palpation. No oral bleeding or lesions appreciated. No trismus. Patient tolerating secretions without difficulty or drooling.  Eyes: Conjunctivae and EOM are normal. Pupils are equal, round, and reactive to light. No scleral icterus.  Neck: Normal range of motion. Neck supple.  No facial swelling  Pulmonary/Chest: Effort normal. No respiratory distress.  Musculoskeletal: Normal range of motion.  Neurological: He is alert and oriented to person, place, and time.  Skin: Skin is warm and dry. No rash noted. He is not diaphoretic. No erythema. No pallor.  Psychiatric: He has a normal mood and affect. His behavior is normal.    ED Course  Procedures (including critical care time)  DIAGNOSTIC STUDIES: Oxygen Saturation is 95% on RA, adequate by my interpretation.    COORDINATION OF CARE: 1:02 PM-Discussed treatment plan which includes an antibiotic and pain medication with pt at bedside and pt agreed to plan. Advised pt to follow up with a dentist.   Labs Review Labs Reviewed - No data to display Imaging Review No results found.  EKG Interpretation   None       MDM   Final diagnoses:  Dentalgia   Uncomplicated dentalgia. Patient well and nontoxic appearing, hemodynamically stable, and afebrile. Patient speaking in full goal oriented sentences. He tolerates secretions without difficulty or drooling. Uvula midline without evidence of peritonsillar abscess. No area of fluctuance to suspect drainable dental abscess. No trismus. No red flags or signs concerning for Ludwig's angina. Patient stable and appropriate for discharge with instruction to followup with a dentist; resource guide provided. Will prescribe penicillin to cover for infection and naproxen for pain control. Short course of Norco prescribed for breakthrough pain. Return precautions provided and patient  agreeable to plan with no unaddressed concerns.  I personally performed the services described in this documentation, which was scribed in my presence. The recorded information has been reviewed and is accurate.   Filed Vitals:   10/08/13 1246  BP: 143/96  Pulse: 105  Temp: 98 F (36.7 C)  TempSrc: Oral  Resp: 20  SpO2: 95%     Antony MaduraKelly Isola Mehlman, PA-C 10/08/13 1313

## 2013-10-08 NOTE — ED Notes (Signed)
Pt c/o right upper toothache for past 2 days. Pt took tylenol and BC powder with little relief.

## 2013-10-09 NOTE — ED Provider Notes (Signed)
Medical screening examination/treatment/procedure(s) were performed by non-physician practitioner and as supervising physician I was immediately available for consultation/collaboration.  EKG Interpretation   None         [place] S Elain Wixon, MD 10/09/13 1203 

## 2013-11-01 ENCOUNTER — Emergency Department: Payer: Self-pay | Admitting: Internal Medicine

## 2013-11-01 LAB — DRUG SCREEN, URINE

## 2013-11-01 LAB — URINALYSIS, COMPLETE
BACTERIA: NONE SEEN
Bilirubin,UR: NEGATIVE
Blood: NEGATIVE
Glucose,UR: NEGATIVE mg/dL (ref 0–75)
Ketone: NEGATIVE
LEUKOCYTE ESTERASE: NEGATIVE
Nitrite: NEGATIVE
PROTEIN: NEGATIVE
Ph: 6 (ref 4.5–8.0)
RBC,UR: 1 /HPF (ref 0–5)
SQUAMOUS EPITHELIAL: NONE SEEN
Specific Gravity: 1.011 (ref 1.003–1.030)
WBC UR: <1 /HPF (ref 0–5)

## 2013-11-01 LAB — BASIC METABOLIC PANEL
ANION GAP: 2 — AB (ref 7–16)
BUN: 11 mg/dL (ref 7–18)
CREATININE: 1.11 mg/dL (ref 0.60–1.30)
Calcium, Total: 9.9 mg/dL (ref 8.5–10.1)
Chloride: 108 mmol/L — ABNORMAL HIGH (ref 98–107)
Co2: 30 mmol/L (ref 21–32)
EGFR (African American): >60
GLUCOSE: 89 mg/dL (ref 65–99)
Osmolality: 278 (ref 275–301)
Potassium: 4.2 mmol/L (ref 3.5–5.1)
SODIUM: 140 mmol/L (ref 136–145)

## 2013-11-01 LAB — CBC
HCT: 41.5 % (ref 40.0–52.0)
HGB: 14.1 g/dL (ref 13.0–18.0)
MCH: 29.2 pg (ref 26.0–34.0)
MCHC: 34 g/dL (ref 32.0–36.0)
MCV: 86 fL (ref 80–100)
Platelet: 284 10*3/uL (ref 150–440)
RBC: 4.84 10*6/uL (ref 4.40–5.90)
RDW: 13.3 % (ref 11.5–14.5)
WBC: 9.6 10*3/uL (ref 3.8–10.6)

## 2013-11-01 LAB — TROPONIN I: Troponin-I: <0.02 ng/mL

## 2013-11-01 LAB — LIPASE, BLOOD: LIPASE: 315 U/L (ref 73–393)

## 2014-05-12 ENCOUNTER — Emergency Department: Payer: Self-pay | Admitting: Emergency Medicine

## 2014-05-16 ENCOUNTER — Emergency Department: Payer: Self-pay | Admitting: Emergency Medicine

## 2014-06-26 ENCOUNTER — Emergency Department: Payer: Self-pay | Admitting: Emergency Medicine

## 2014-08-26 IMAGING — CR DG CHEST 2V
1 series · 2 of 2 positions shown · non-contrast
Comparison: none

REASON FOR EXAM: dizziness
COMMENTS:

[Series 1: x chest ap · 0.14mm/px · 2 of 2 slices shown]
[im 1/2]
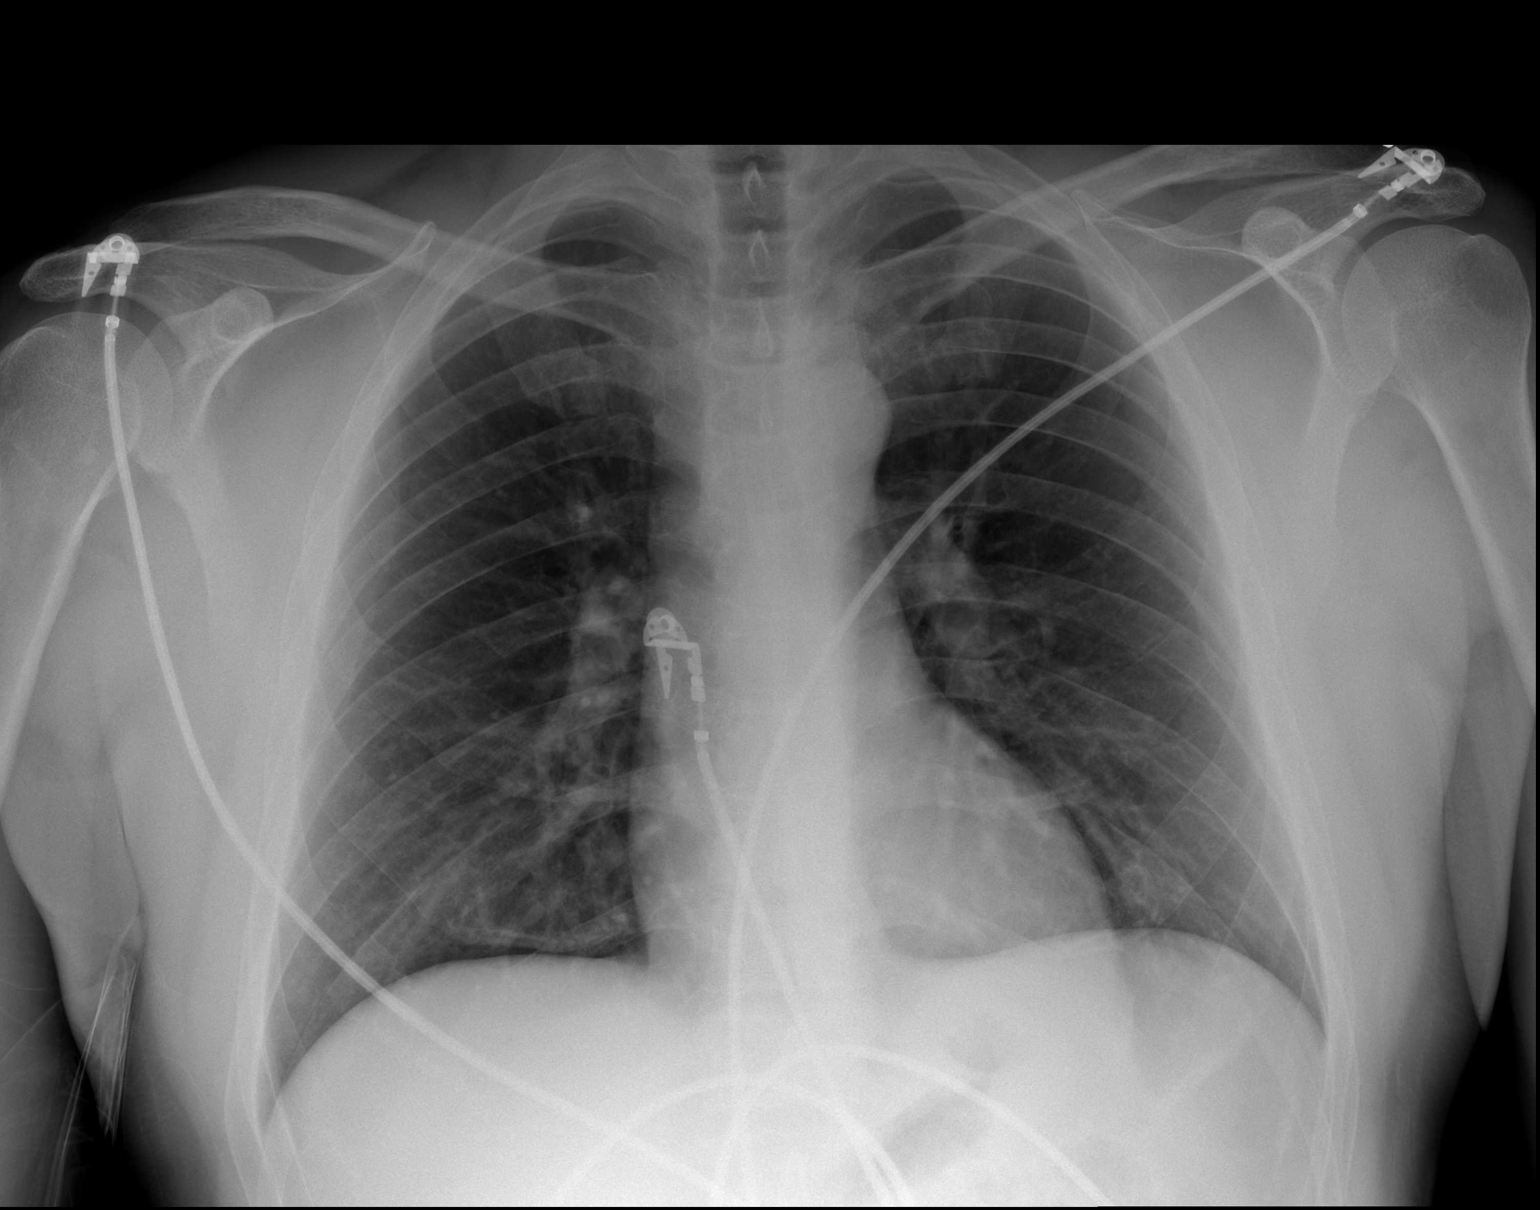
[im 2/2]
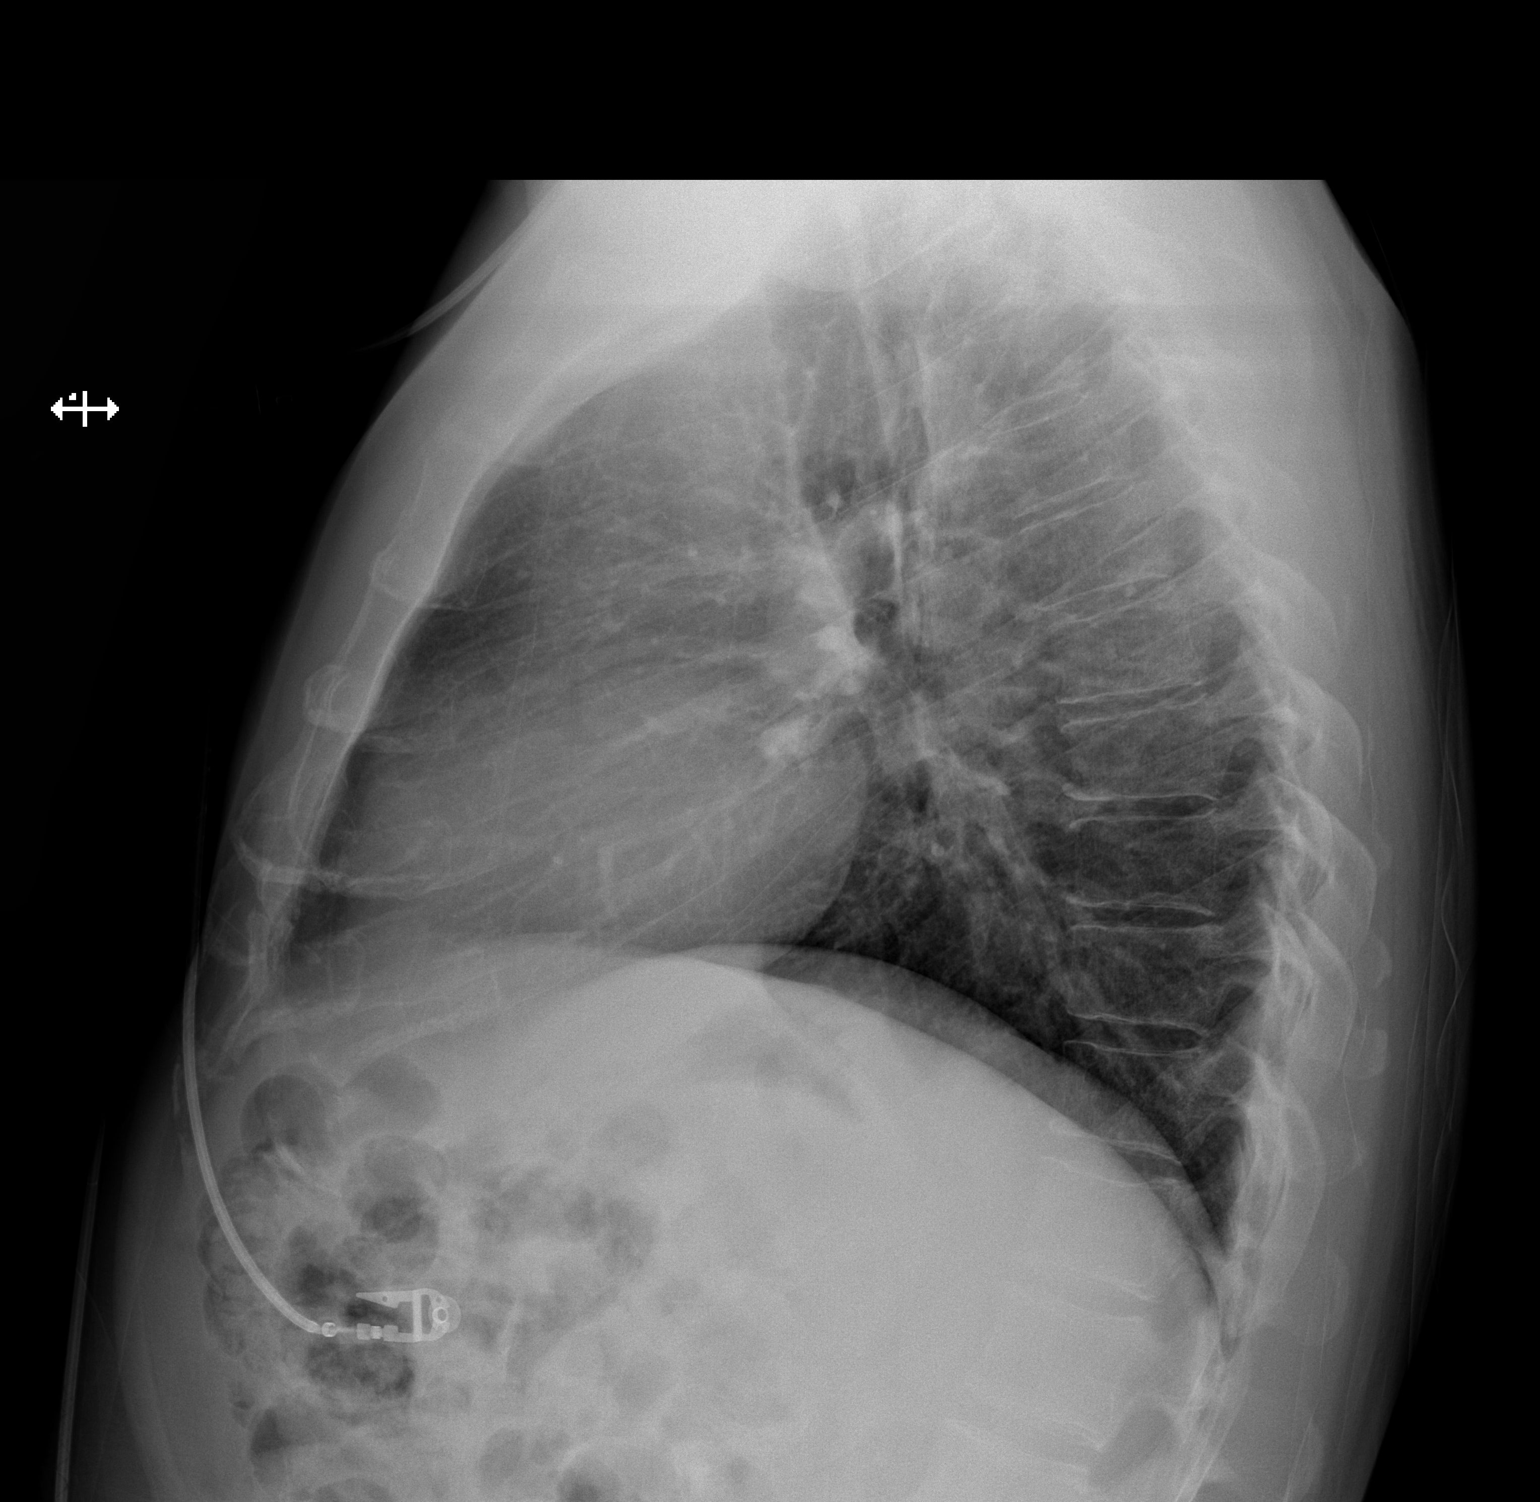

[2 of 2 positions shown; findings below may reference images not displayed]

PROCEDURE:     DXR - DXR CHEST PA (OR AP) AND LATERAL  - June 12, 2013 [DATE]

RESULT:     Comparison is made to the study of 10/18/2012.

The lungs are clear. The heart and pulmonary vessels are normal. The bony
and mediastinal structures are unremarkable. There is no effusion. There is
no pneumothorax or evidence of congestive failure.
IMPRESSION: No acute cardiopulmonary disease.

[REDACTED]

## 2014-11-15 ENCOUNTER — Emergency Department: Admit: 2014-11-15 | Discharge status: Against Medical Advice | Payer: Self-pay | Admitting: Emergency Medicine

## 2014-11-15 ENCOUNTER — Emergency Department
Admit: 2014-11-15 | Disposition: A | Discharge status: Home / Self Care | Payer: Self-pay | Admitting: Emergency Medicine

## 2014-12-04 NOTE — Op Note (Signed)
PATIENT NAME:  Carlos Rubio, Carlos L MR#:  [id number] DATE OF BIRTH:  May 10, 1971  DATE OF PROCEDURE:  06/27/2012  PREOPERATIVE DIAGNOSIS: Right inguinal hernia.   POSTOPERATIVE DIAGNOSIS: Bilateral inguinal hernias.   PROCEDURE PERFORMED: Laparoscopic preperitoneal bilateral inguinal hernia repair with mesh.   SURGEON: Natale LayMark Nero Sawatzky, MD  ASSISTANT: Dionne Miloichard Cooper, MD  TYPE OF ANESTHESIA: General endotracheal.   FINDINGS:  1. Small indirect left inguinal hernia.  2. Small direct and moderate-sized left indirect inguinal hernia.   SPECIMENS: None.   ESTIMATED BLOOD LOSS: Minimal.   DESCRIPTION OF PROCEDURE: With the patient in the supine position, general endotracheal anesthesia was induced. Foley catheter was placed under sterile technique. His abdomen was previously clipped of hair, prepped and draped with ChloraPrep solution. Time out was observed.   A diagonally placed incision was fashioned infraumbilical and to the right of the umbilicus with scalpel and carried down with blunt dissection to the anterior rectus sheath. Anterior rectus sheath was incised in vertical orientation laterally. The rectus muscle was swept laterally. The preperitoneal space was entered bluntly. The dissecting balloon trocar was placed and inflated under direct visualization. This was exchanged out for the operating trocar.   CO2 was insufflated in the preperitoneal space.   Two 5 mm ports were then placed in the midline just below the balloon. Dissection was undertaken on the right side first. Cooper's ligament was identified. Lateral to the cord structures a space was made in the anterior abdominal wall reflecting the peritoneum posteriorly. The spermatic cord was identified. A window was fashioned beneath this. Dissection in the cord structure demonstrated a large hernia sac which was dissected inferiorly and posteriorly. A small direct hernia was also identified.  A 4 x 6 piece of Atrium Prolite mesh was brought onto  the field, cut with two tails for the egress of the spermatic cord and this was then inserted into the preperitoneal space. It was then unfurled in the preperitoneal space securing it to the shelving edge of Cooper's ligament. Two tails were placed on either side of the spermatic cord and extended laterally. They were secured laterally with the ProTack device. Anteriorly mesh was brought out over towards the anterior abdominal wall and secured to the anterior abdominal wall with ProTack. One ProTack was placed lateral to the spermatic cord from one mesh leaflet to the other this recreating the internal ring.   Attention was then turned to the left side. Similar dissection was undertaken. Spermatic cord was skeletonized. Indirect inguinal hernia was identified.  A 4 x 6 piece of Atrium Prolite mesh was also brought onto the field, spatulated as before, inserted into the preperitoneal space, and secured in the standard fashion with ProTack device, as similar on the right side.   Pneumoperitoneum was then released. The lateral tails were then secured while the pneumoperitoneum was released utilizing a grasper on either side. The anterior fascia was then reapproximated with a figure-of-eight #0 Vicryl suture. A total of 30 mL of 0.25% plain Marcaine was infiltrated along all skin and fascial incisions prior to closure. Skin edges were reapproximated utilizing 4-0 Monocryl. Steri-Strips, benzoin, Telfa, and Tegaderm were then applied. The patient was then subsequently extubated, Foley catheter was removed, and taken to the recovery room in stable and satisfactory condition by anesthesia services.   ____________________________ Redge GainerMark A. Egbert GaribaldiBird, MD mab:slb D: 06/27/2012 15:12:34 ET     T: 06/27/2012 15:31:56 ET        JOB#: [id number] cc: Loraine LericheMark A. Egbert GaribaldiBird, MD, <Dictator> Donna Silverman A  Beola Vasallo MD ELECTRONICALLY SIGNED 06/27/2012 17:35

## 2015-02-19 ENCOUNTER — Other Ambulatory Visit: Payer: Self-pay | Admitting: Family Medicine

## 2015-02-19 ENCOUNTER — Ambulatory Visit
Admission: RE | Admit: 2015-02-19 | Discharge: 2015-02-19 | Disposition: A | Discharge status: Home / Self Care | Payer: Medicaid Other | Source: Ambulatory Visit | Attending: Family Medicine | Admitting: Family Medicine

## 2015-02-19 DIAGNOSIS — M79642 Pain in left hand: Secondary | ICD-10-CM

## 2017-10-01 ENCOUNTER — Encounter: Payer: Self-pay | Admitting: Family Medicine

## 2017-10-01 ENCOUNTER — Ambulatory Visit (INDEPENDENT_AMBULATORY_CARE_PROVIDER_SITE_OTHER): Payer: Medicaid Other | Admitting: Family Medicine

## 2017-10-01 VITALS — BP 153/93 | HR 73 | Temp 98.3°F | Ht 65.13 in | Wt 158.9 lb

## 2017-10-01 DIAGNOSIS — I10 Essential (primary) hypertension: Secondary | ICD-10-CM

## 2017-10-01 DIAGNOSIS — F319 Bipolar disorder, unspecified: Secondary | ICD-10-CM

## 2017-10-01 DIAGNOSIS — F3130 Bipolar disorder, current episode depressed, mild or moderate severity, unspecified: Secondary | ICD-10-CM

## 2017-10-01 DIAGNOSIS — E291 Testicular hypofunction: Secondary | ICD-10-CM | POA: Diagnosis not present

## 2017-10-01 DIAGNOSIS — Z7689 Persons encountering health services in other specified circumstances: Secondary | ICD-10-CM | POA: Diagnosis not present

## 2017-10-01 DIAGNOSIS — M25552 Pain in left hip: Secondary | ICD-10-CM

## 2017-10-01 MED ORDER — QUETIAPINE FUMARATE 100 MG PO TABS: ORAL_TABLET | ORAL | 0 refills | Status: DC

## 2017-10-01 MED ORDER — LISINOPRIL 20 MG PO TABS: 20.0000 mg | ORAL_TABLET | Freq: Every day | ORAL | 1 refills | Status: DC

## 2017-10-01 NOTE — Progress Notes (Signed)
BP (!) 153/93 (BP Location: Right Arm, Patient Position: Sitting, Cuff Size: Normal)   Pulse 73   Temp 98.3 F (36.8 C) (Oral)   Ht 5' 5.13" (1.654 m)   Wt 158 lb 14.4 oz (72.1 kg)   SpO2 98%   BMI 26.34 kg/m    Subjective:    Patient ID: Carlos Rubio, male    DOB: 09-19-1970, 47 y.o.   MRN: [id number]  HPI: Carlos Rubio is a 47 y.o. male  Chief Complaint  Patient presents with  . Establish Care  . Easily Angered    Patient states he can't focus and get aggravated easily.  . Hypertension   Pt here today to establish care after moving from [facility] recently. Has been off all medications x several months since moving.   Hx of HTN, previously under good control with lisinopril (he thinks 20 mg). Denies CP, SOB, dizziness, HAs. Readings have been in the 150s-160s/80s-90s when he checks at the drugstore.   Hx of low testosterone, previously getting regular injections for replacement. Wanting to get back on these.   Having significant left hip and leg pain since breaking the leg several years ago and requiring a hip replacement and large rod in his femur. Has been on pain medication off and on for this. OTC pain relievers ineffective.   Main concern today is his mood control and anger issues. Sometimes feels very down, unmotivated, fatigued and other times is uncontrollably angry. Feels he has significant focus issues, which both of his son's do as well. Has never been on medications for anything like this in the past per pt. Denies any SI/HI, hallucinations, self harm. 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   Past Medical History:  Diagnosis Date  . Back pain   . Hypertension   . Kidney stones   . Leg fracture   . Low testosterone   . Opioid type dependence, abuse (HCC)   . Opioid withdrawal (HCC)    Social History   Socioeconomic History  . Marital status: Widowed    Spouse name: Not on file  . Number of children: Not on file  . Years of  education: Not on file  . Highest education level: Not on file  Social Needs  . Financial resource strain: Not on file  . Food insecurity - worry: Not on file  . Food insecurity - inability: Not on file  . Transportation needs - medical: Not on file  . Transportation needs - non-medical: Not on file  Occupational History  . Not on file  Tobacco Use  . Smoking status: Current Some Day Smoker  . Smokeless tobacco: Never Used  Substance and Sexual Activity  . Alcohol use: Yes    Comment: Socially  . Drug use: No  . Sexual activity: Not on file  Other Topics Concern  . Not on file  Social History Narrative  . Not on file   Relevant past medical, surgical, family and social history reviewed and updated as indicated. Interim medical history since our last visit reviewed. Allergies and medications reviewed and updated.  Review of Systems  Per HPI unless specifically indicated above     Objective:    BP (!) 153/93 (BP Location: Right Arm, Patient Position: Sitting, Cuff Size: Normal)   Pulse 73   Temp 98.3 F (36.8 C) (Oral)   Ht 5' 5.13" (1.654 m)   Wt 158 lb 14.4 oz (72.1 kg)   SpO2  98%   BMI 26.34 kg/m   Wt Readings from Last 3 Encounters:  10/01/17 158 lb 14.4 oz (72.1 kg)  11/07/11 170 lb (77.1 kg)    Physical Exam  Constitutional: He is oriented to person, place, and time. He appears well-developed and well-nourished. No distress.  HENT:  Head: Atraumatic.  Eyes: Conjunctivae are normal. Pupils are equal, round, and reactive to light.  Neck: Normal range of motion. Neck supple.  Cardiovascular: Normal rate and normal heart sounds.  Pulmonary/Chest: Effort normal and breath sounds normal. No respiratory distress.  Musculoskeletal: Normal range of motion. He exhibits no edema or deformity.  Neurological: He is alert and oriented to person, place, and time. No cranial nerve deficit.  Skin: Skin is warm and dry.  Psychiatric: He has a normal mood and affect. His  behavior is normal. Thought content normal.  Nursing note and vitals reviewed.  Results for orders placed or performed during the hospital encounter of 11/07/11  Comprehensive metabolic panel  Result Value Ref Range   Sodium 140 135 - 145 mEq/L   Potassium 3.8 3.5 - 5.1 mEq/L   Chloride 102 96 - 112 mEq/L   CO2 25 19 - 32 mEq/L   Glucose, Bld 99 70 - 99 mg/dL   BUN 7 6 - 23 mg/dL   Creatinine, Ser 6.96 0.50 - 1.35 mg/dL   Calcium 9.5 8.4 - 29.5 mg/dL   Total Protein 7.9 6.0 - 8.3 g/dL   Albumin 4.6 3.5 - 5.2 g/dL   AST 28 0 - 37 U/L   ALT 23 0 - 53 U/L   Alkaline Phosphatase 46 39 - 117 U/L   Total Bilirubin 0.2 (L) 0.3 - 1.2 mg/dL   GFR calc non Af Amer >90 >90 mL/min   GFR calc Af Amer >90 >90 mL/min  CBC  Result Value Ref Range   WBC 10.3 4.0 - 10.5 K/uL   RBC 4.98 4.22 - 5.81 MIL/uL   Hemoglobin 13.7 13.0 - 17.0 g/dL   HCT 28.4 13.2 - 44.0 %   MCV 82.5 78.0 - 100.0 fL   MCH 27.5 26.0 - 34.0 pg   MCHC 33.3 30.0 - 36.0 g/dL   RDW 10.2 72.5 - 36.6 %   Platelets 220 150 - 400 K/uL  Urinalysis with microscopic  Result Value Ref Range   Color, Urine YELLOW YELLOW   APPearance CLEAR CLEAR   Specific Gravity, Urine 1.014 1.005 - 1.030   pH 6.0 5.0 - 8.0   Glucose, UA 100 (A) NEGATIVE mg/dL   Hgb urine dipstick SMALL (A) NEGATIVE   Bilirubin Urine NEGATIVE NEGATIVE   Ketones, ur NEGATIVE NEGATIVE mg/dL   Protein, ur NEGATIVE NEGATIVE mg/dL   Urobilinogen, UA 0.2 0.0 - 1.0 mg/dL   Nitrite NEGATIVE NEGATIVE   Leukocytes, UA NEGATIVE NEGATIVE   RBC / HPF 0-2 <3 RBC/hpf   Bacteria, UA RARE RARE   Squamous Epithelial / LPF RARE RARE  Lactic acid, plasma  Result Value Ref Range   Lactic Acid, Venous 1.9 0.5 - 2.2 mmol/L  Protime-INR  Result Value Ref Range   Prothrombin Time 13.4 11.6 - 15.2 seconds   INR 1.00 0.00 - 1.49  Ethanol  Result Value Ref Range   Alcohol, Ethyl (B) 165 (H) 0 - 11 mg/dL  Urine rapid drug screen (hosp performed)  Result Value Ref Range    Opiates POSITIVE (A) NONE DETECTED   Cocaine NONE DETECTED NONE DETECTED   Benzodiazepines NONE DETECTED NONE DETECTED  Amphetamines NONE DETECTED NONE DETECTED   Tetrahydrocannabinol NONE DETECTED NONE DETECTED   Barbiturates NONE DETECTED NONE DETECTED  I-STAT, chem 8  Result Value Ref Range   Sodium 145 135 - 145 mEq/L   Potassium 3.6 3.5 - 5.1 mEq/L   Chloride 106 96 - 112 mEq/L   BUN 7 6 - 23 mg/dL   Creatinine, Ser 4.541.10 0.50 - 1.35 mg/dL   Glucose, Bld 99 70 - 99 mg/dL   Calcium, Ion 0.981.17 1.191.12 - 1.32 mmol/L   TCO2 26 0 - 100 mmol/L   Hemoglobin 15.0 13.0 - 17.0 g/dL   HCT 14.744.0 82.939.0 - 56.252.0 %  Type and screen  Result Value Ref Range   ABO/RH(D) B NEG    Antibody Screen NEG    Sample Expiration 11/10/2011   ABO/Rh  Result Value Ref Range   ABO/RH(D) B NEG       Assessment & Plan:   Problem List Items Addressed This Visit      Cardiovascular and Mediastinum   Hypertension    Will restart lisinopril and monitor closely. Will recheck in 1 month with BMP. F/u if side effects or persistent abnormal readings in meantime      Relevant Medications   lisinopril (PRINIVIL,ZESTRIL) 20 MG tablet     Other   Bipolar depression (HCC)    After long discussion, suspect this is what's causing his sleep and focus disturbances as well as his significant mood shifts/angry outbursts. Will try seroquel and titrate up for benefit. Risks and benefits reviewed with pt.        Other Visit Diagnoses    Left hip pain    -  Primary   Pt interested in injections/nerve blocks, will refer to pain management for further eval into this. Continue OTC pain relievers, low impact exercises   Relevant Orders   Ambulatory referral to Pain Clinic   Encounter to establish care       Hypogonadism male       Will refer to Urology for management and supplementation. Previously under good control with testosterone injections   Relevant Orders   Ambulatory referral to Urology       Follow up  plan: Return in about 6 weeks (around 11/12/2017) for Mood , BP f/u, BMP.

## 2017-10-01 NOTE — Patient Instructions (Addendum)
Quetiapine tablets What is this medicine? QUETIAPINE (kwe TYE a peen) is an antipsychotic. It is used to treat schizophrenia and bipolar disorder, also known as manic-depression. This medicine may be used for other purposes; ask your health care provider or pharmacist if you have questions. COMMON BRAND NAME(S): Seroquel What should I tell my health care provider before I take this medicine? They need to know if you have any of these conditions: -brain tumor or head injury -breast cancer -cataracts -diabetes -difficulty swallowing -heart disease -kidney disease -liver disease -low blood counts, like low white cell, platelet, or red cell counts -low blood pressure or dizziness when standing up -Parkinson's disease -previous heart attack -seizures -suicidal thoughts, plans, or attempt by you or a family member -thyroid disease -an unusual or allergic reaction to quetiapine, other medicines, foods, dyes, or preservatives -pregnant or trying to get pregnant -breast-feeding How should I use this medicine? Take this medicine by mouth. Swallow it with a drink of water. Follow the directions on the prescription label. If it upsets your stomach you can take it with food. Take your medicine at regular intervals. Do not take it more often than directed. Do not stop taking except on the advice of your doctor or health care professional. A special MedGuide will be given to you by the pharmacist with each prescription and refill. Be sure to read this information carefully each time. Talk to your pediatrician regarding the use of this medicine in children. While this drug may be prescribed for children as young as 10 years for selected conditions, precautions do apply. Patients over age 65 years may have a stronger reaction to this medicine and need smaller doses. Overdosage: If you think you have taken too much of this medicine contact a poison control center or emergency room at once. NOTE: This  medicine is only for you. Do not share this medicine with others. What if I miss a dose? If you miss a dose, take it as soon as you can. If it is almost time for your next dose, take only that dose. Do not take double or extra doses. What may interact with this medicine? Do not take this medicine with any of the following medications: -certain medicines for fungal infections like fluconazole, itraconazole, ketoconazole, posaconazole, voriconazole -cisapride -dofetilide -dronedarone -droperidol -grepafloxacin -halofantrine -phenothiazines like chlorpromazine, mesoridazine, thioridazine -pimozide -sparfloxacin -ziprasidone This medicine may also interact with the following medications: -alcohol -antiviral medicines for HIV or AIDS -certain medicines for blood pressure -certain medicines for depression, anxiety, or psychotic disturbances like haloperidol, lorazepam -certain medicines for diabetes -certain medicines for Parkinson's disease -certain medicines for seizures like carbamazepine, phenobarbital, phenytoin -cimetidine -erythromycin -other medicines that prolong the QT interval (cause an abnormal heart rhythm) -rifampin -steroid medicines like prednisone or cortisone This list may not describe all possible interactions. Give your health care provider a list of all the medicines, herbs, non-prescription drugs, or dietary supplements you use. Also tell them if you smoke, drink alcohol, or use illegal drugs. Some items may interact with your medicine. What should I watch for while using this medicine? Visit your doctor or health care professional for regular checks on your progress. It may be several weeks before you see the full effects of this medicine. Your health care provider may suggest that you have your eyes examined prior to starting this medicine, and every 6 months thereafter. If you have been taking this medicine regularly for some time, do not suddenly stop taking it.  You must gradually   reduce the dose or your symptoms may get worse. Ask your doctor or health care professional for advice. Patients and their families should watch out for worsening depression or thoughts of suicide. Also watch out for sudden or severe changes in feelings such as feeling anxious, agitated, panicky, irritable, hostile, aggressive, impulsive, severely restless, overly excited and hyperactive, or not being able to sleep. If this happens, especially at the beginning of antidepressant treatment or after a change in dose, call your health care professional. You may get dizzy or drowsy. Do not drive, use machinery, or do anything that needs mental alertness until you know how this medicine affects you. Do not stand or sit up quickly, especially if you are an older patient. This reduces the risk of dizzy or fainting spells. Alcohol can increase dizziness and drowsiness. Avoid alcoholic drinks. Do not treat yourself for colds, diarrhea or allergies. Ask your doctor or health care professional for advice, some ingredients may increase possible side effects. This medicine can reduce the response of your body to heat or cold. Dress warm in cold weather and stay hydrated in hot weather. If possible, avoid extreme temperatures like saunas, hot tubs, very hot or cold showers, or activities that can cause dehydration such as vigorous exercise. What side effects may I notice from receiving this medicine? Side effects that you should report to your doctor or health care professional as soon as possible: -allergic reactions like skin rash, itching or hives, swelling of the face, lips, or tongue -difficulty swallowing -fast or irregular heartbeat -fever or chills, sore throat -fever with rash, swollen lymph nodes, or swelling of the face -increased hunger or thirst -increased urination -problems with balance, talking, walking -seizures -stiff muscles -suicidal thoughts or other mood  changes -uncontrollable head, mouth, neck, arm, or leg movements -unusually weak or tired Side effects that usually do not require medical attention (report to your doctor or health care professional if they continue or are bothersome): -change in sex drive or performance -constipation -drowsy or dizzy -dry mouth -stomach upset -weight gain This list may not describe all possible side effects. Call your doctor for medical advice about side effects. You may report side effects to FDA at 1-800-FDA-1088. Where should I keep my medicine? Keep out of the reach of children. Store at room temperature between 15 and 30 degrees C (59 and 86 degrees F). Throw away any unused medicine after the expiration date. NOTE: This sheet is a summary. It may not cover all possible information. If you have questions about this medicine, talk to your doctor, pharmacist, or health care provider.  2018 Elsevier/Gold Standard (2015-02-05 13:07:35)  

## 2017-10-03 DIAGNOSIS — F319 Bipolar disorder, unspecified: Secondary | ICD-10-CM | POA: Insufficient documentation

## 2017-10-03 DIAGNOSIS — I10 Essential (primary) hypertension: Secondary | ICD-10-CM | POA: Insufficient documentation

## 2017-10-03 NOTE — Assessment & Plan Note (Signed)
After long discussion, suspect this is what's causing his sleep and focus disturbances as well as his significant mood shifts/angry outbursts. Will try seroquel and titrate up for benefit. Risks and benefits reviewed with pt.

## 2017-10-03 NOTE — Assessment & Plan Note (Signed)
Will restart lisinopril and monitor closely. Will recheck in 1 month with BMP. F/u if side effects or persistent abnormal readings in meantime

## 2017-10-22 ENCOUNTER — Ambulatory Visit: Payer: Medicaid - Out of State

## 2017-10-29 ENCOUNTER — Ambulatory Visit: Payer: Medicaid - Out of State | Admitting: Urology

## 2017-10-29 ENCOUNTER — Encounter: Payer: Self-pay | Admitting: Urology

## 2017-11-12 ENCOUNTER — Ambulatory Visit: Payer: Medicaid - Out of State | Admitting: Family Medicine

## 2020-04-16 DIAGNOSIS — F17200 Nicotine dependence, unspecified, uncomplicated: Secondary | ICD-10-CM | POA: Insufficient documentation

## 2020-04-29 DIAGNOSIS — S82832A Other fracture of upper and lower end of left fibula, initial encounter for closed fracture: Secondary | ICD-10-CM | POA: Insufficient documentation

## 2020-04-29 DIAGNOSIS — S93325A Dislocation of tarsometatarsal joint of left foot, initial encounter: Secondary | ICD-10-CM | POA: Insufficient documentation

## 2020-05-07 ENCOUNTER — Emergency Department: Payer: Self-pay

## 2020-05-07 ENCOUNTER — Other Ambulatory Visit: Payer: Self-pay

## 2020-05-07 ENCOUNTER — Emergency Department
Admission: EM | Admit: 2020-05-07 | Discharge: 2020-05-07 | Disposition: A | Discharge status: Home / Self Care | Payer: Self-pay | Attending: Emergency Medicine | Admitting: Emergency Medicine

## 2020-05-07 DIAGNOSIS — Z5321 Procedure and treatment not carried out due to patient leaving prior to being seen by health care provider: Secondary | ICD-10-CM | POA: Insufficient documentation

## 2020-05-07 DIAGNOSIS — R141 Gas pain: Secondary | ICD-10-CM | POA: Insufficient documentation

## 2020-05-07 DIAGNOSIS — R109 Unspecified abdominal pain: Secondary | ICD-10-CM | POA: Insufficient documentation

## 2020-05-07 NOTE — ED Notes (Signed)
Pt asked if he will be going to xray now that he is finished in the restroom? Pt states he is feeling much better and will be going home soon.

## 2020-05-07 NOTE — ED Notes (Addendum)
This tech went in triage rm to obtain blood from pt. Pt complaining about having to have blood work done. Pt stated "I only came here to get something to help me go to the bathroom." Pt refusing blood work at this time and wants to see an MD before blood work is done. MD York Cerise made aware as Willis Modena spoke with him about pt. Pt sent back out to the lobby to wait.

## 2020-05-07 NOTE — ED Notes (Addendum)
XR tech came to triage twice to get pt and both times pt was in bathroom. This tech went in bathroom to get pt and pt was sitting on the toilet having a BM. This tech informed pt that XR was ready for him. Pt stated "I don't need one. I am filling up this toilet now. After I am done in here then I am good to go home." This tech asked pt if he was refusing the XR. Pt stated "yes I do not need it." This tech informed XR tech and Andrea,RN that pt refused XR.

## 2020-05-07 NOTE — ED Triage Notes (Signed)
PT to ED c/o not having BM in 2 weeks, resulting in abd pain. PT is passing a little gas. Has been taking stool softener with no relief. Nausea but no vomiting.

## 2020-06-29 ENCOUNTER — Other Ambulatory Visit: Payer: Self-pay

## 2020-06-29 ENCOUNTER — Encounter: Payer: Self-pay | Admitting: Emergency Medicine

## 2020-06-29 ENCOUNTER — Emergency Department: Payer: Self-pay

## 2020-06-29 DIAGNOSIS — Y92410 Unspecified street and highway as the place of occurrence of the external cause: Secondary | ICD-10-CM | POA: Insufficient documentation

## 2020-06-29 DIAGNOSIS — I10 Essential (primary) hypertension: Secondary | ICD-10-CM | POA: Insufficient documentation

## 2020-06-29 DIAGNOSIS — F17200 Nicotine dependence, unspecified, uncomplicated: Secondary | ICD-10-CM | POA: Insufficient documentation

## 2020-06-29 DIAGNOSIS — Z79899 Other long term (current) drug therapy: Secondary | ICD-10-CM | POA: Insufficient documentation

## 2020-06-29 DIAGNOSIS — S93326D Dislocation of tarsometatarsal joint of unspecified foot, subsequent encounter: Secondary | ICD-10-CM | POA: Insufficient documentation

## 2020-06-29 DIAGNOSIS — M9689 Other intraoperative and postprocedural complications and disorders of the musculoskeletal system: Secondary | ICD-10-CM | POA: Insufficient documentation

## 2020-06-29 LAB — COMPREHENSIVE METABOLIC PANEL
ALT: 9 U/L (ref 0–44)
AST: 14 U/L — ABNORMAL LOW (ref 15–41)
Albumin: 4.2 g/dL (ref 3.5–5.0)
Alkaline Phosphatase: 78 U/L (ref 38–126)
Anion gap: 10 (ref 5–15)
BUN: 13 mg/dL (ref 6–20)
CO2: 28 mmol/L (ref 22–32)
Calcium: 9.5 mg/dL (ref 8.9–10.3)
Chloride: 98 mmol/L (ref 98–111)
Creatinine, Ser: 0.94 mg/dL (ref 0.61–1.24)
GFR, Estimated: >60 mL/min (ref >60)
Glucose, Bld: 99 mg/dL (ref 70–99)
Potassium: 3.7 mmol/L (ref 3.5–5.1)
Sodium: 136 mmol/L (ref 135–145)
Total Bilirubin: 0.5 mg/dL (ref 0.3–1.2)
Total Protein: 8.1 g/dL (ref 6.5–8.1)

## 2020-06-29 LAB — CBC WITH DIFFERENTIAL/PLATELET
Abs Immature Granulocytes: 0.02 10*3/uL (ref 0.00–0.07)
Basophils Absolute: 0.1 10*3/uL (ref 0.0–0.1)
Basophils Relative: 1 %
Eosinophils Absolute: 0.1 10*3/uL (ref 0.0–0.5)
Eosinophils Relative: 1 %
HCT: 37.3 % — ABNORMAL LOW (ref 39.0–52.0)
Hemoglobin: 12.8 g/dL — ABNORMAL LOW (ref 13.0–17.0)
Immature Granulocytes: 0 %
Lymphocytes Relative: 24 %
Lymphs Abs: 1.9 10*3/uL (ref 0.7–4.0)
MCH: 29.8 pg (ref 26.0–34.0)
MCHC: 34.3 g/dL (ref 30.0–36.0)
MCV: 86.9 fL (ref 80.0–100.0)
Monocytes Absolute: 0.7 10*3/uL (ref 0.1–1.0)
Monocytes Relative: 8 %
Neutro Abs: 5.1 10*3/uL (ref 1.7–7.7)
Neutrophils Relative %: 66 %
Platelets: 255 10*3/uL (ref 150–400)
RBC: 4.29 MIL/uL (ref 4.22–5.81)
RDW: 12.4 % (ref 11.5–15.5)
WBC: 7.8 10*3/uL (ref 4.0–10.5)
nRBC: 0 % (ref 0.0–0.2)

## 2020-06-29 LAB — LACTIC ACID, PLASMA: Lactic Acid, Venous: 0.8 mmol/L (ref 0.5–1.9)

## 2020-06-29 NOTE — ED Triage Notes (Signed)
Patient states that he had surgery at the end of September for a left foot fracture. Patient states that he has had redness and swelling to the top of his foot with drainage times 5 days.

## 2020-06-30 ENCOUNTER — Emergency Department
Admission: EM | Admit: 2020-06-30 | Discharge: 2020-06-30 | Discharge status: Against Medical Advice | Payer: Self-pay | Attending: Emergency Medicine | Admitting: Emergency Medicine

## 2020-06-30 DIAGNOSIS — M9689 Other intraoperative and postprocedural complications and disorders of the musculoskeletal system: Secondary | ICD-10-CM

## 2020-06-30 DIAGNOSIS — S93325D Dislocation of tarsometatarsal joint of left foot, subsequent encounter: Secondary | ICD-10-CM

## 2020-06-30 NOTE — ED Notes (Signed)
Patient alert and oriented x4, given instructions on leaving against medical advice, including risk of worsening medical condition and death. Pt verbalized understanding and signed AMA form. AMA form to be scanned into chart.

## 2020-06-30 NOTE — ED Notes (Signed)
Pt stating he does not want to have CT scan done, states he wants to leave and follow up with primary care doctor in the morning. Katrinka Blazing MD made aware.
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
## 2021-04-09 ENCOUNTER — Emergency Department
Admission: EM | Admit: 2021-04-09 | Discharge: 2021-04-10 | Disposition: A | Discharge status: Home / Self Care | Payer: Self-pay | Attending: Emergency Medicine | Admitting: Emergency Medicine

## 2021-04-09 ENCOUNTER — Other Ambulatory Visit: Payer: Self-pay

## 2021-04-09 DIAGNOSIS — Z79899 Other long term (current) drug therapy: Secondary | ICD-10-CM | POA: Insufficient documentation

## 2021-04-09 DIAGNOSIS — F17200 Nicotine dependence, unspecified, uncomplicated: Secondary | ICD-10-CM | POA: Insufficient documentation

## 2021-04-09 DIAGNOSIS — I10 Essential (primary) hypertension: Secondary | ICD-10-CM | POA: Diagnosis present

## 2021-04-09 DIAGNOSIS — X58XXXA Exposure to other specified factors, initial encounter: Secondary | ICD-10-CM | POA: Insufficient documentation

## 2021-04-09 DIAGNOSIS — F322 Major depressive disorder, single episode, severe without psychotic features: Secondary | ICD-10-CM

## 2021-04-09 DIAGNOSIS — F1110 Opioid abuse, uncomplicated: Secondary | ICD-10-CM

## 2021-04-09 DIAGNOSIS — T401X1A Poisoning by heroin, accidental (unintentional), initial encounter: Secondary | ICD-10-CM | POA: Insufficient documentation

## 2021-04-09 DIAGNOSIS — F32A Depression, unspecified: Secondary | ICD-10-CM

## 2021-04-09 DIAGNOSIS — Z20822 Contact with and (suspected) exposure to covid-19: Secondary | ICD-10-CM | POA: Insufficient documentation

## 2021-04-09 LAB — COMPREHENSIVE METABOLIC PANEL
ALT: 14 U/L (ref 0–44)
AST: 21 U/L (ref 15–41)
Albumin: 4.8 g/dL (ref 3.5–5.0)
Alkaline Phosphatase: 87 U/L (ref 38–126)
Anion gap: 9 (ref 5–15)
BUN: 18 mg/dL (ref 6–20)
CO2: 29 mmol/L (ref 22–32)
Calcium: 9.4 mg/dL (ref 8.9–10.3)
Chloride: 101 mmol/L (ref 98–111)
Creatinine, Ser: 0.84 mg/dL (ref 0.61–1.24)
GFR, Estimated: >60 mL/min (ref >60)
Glucose, Bld: 52 mg/dL — ABNORMAL LOW (ref 70–99)
Potassium: 3.5 mmol/L (ref 3.5–5.1)
Sodium: 139 mmol/L (ref 135–145)
Total Bilirubin: 0.5 mg/dL (ref 0.3–1.2)
Total Protein: 8.6 g/dL — ABNORMAL HIGH (ref 6.5–8.1)

## 2021-04-09 LAB — URINE DRUG SCREEN, QUALITATIVE (ARMC ONLY)
Amphetamines, Ur Screen: POSITIVE — AB
Barbiturates, Ur Screen: NOT DETECTED
Benzodiazepine, Ur Scrn: NOT DETECTED
Cannabinoid 50 Ng, Ur ~~LOC~~: NOT DETECTED
Cocaine Metabolite,Ur ~~LOC~~: NOT DETECTED
MDMA (Ecstasy)Ur Screen: NOT DETECTED
Methadone Scn, Ur: NOT DETECTED
Opiate, Ur Screen: NOT DETECTED
Phencyclidine (PCP) Ur S: NOT DETECTED
Tricyclic, Ur Screen: NOT DETECTED

## 2021-04-09 LAB — SALICYLATE LEVEL: Salicylate Lvl: <7 mg/dL — ABNORMAL LOW (ref 7.0–30.0)

## 2021-04-09 LAB — ETHANOL: Alcohol, Ethyl (B): <10 mg/dL (ref <10)

## 2021-04-09 LAB — CBC
HCT: 39.2 % (ref 39.0–52.0)
Hemoglobin: 13.9 g/dL (ref 13.0–17.0)
MCH: 30.2 pg (ref 26.0–34.0)
MCHC: 35.5 g/dL (ref 30.0–36.0)
MCV: 85 fL (ref 80.0–100.0)
Platelets: 278 10*3/uL (ref 150–400)
RBC: 4.61 MIL/uL (ref 4.22–5.81)
RDW: 12.7 % (ref 11.5–15.5)
WBC: 10.5 10*3/uL (ref 4.0–10.5)
nRBC: 0 % (ref 0.0–0.2)

## 2021-04-09 LAB — RESP PANEL BY RT-PCR (FLU A&B, COVID) ARPGX2
Influenza A by PCR: NEGATIVE
Influenza B by PCR: NEGATIVE
SARS Coronavirus 2 by RT PCR: NEGATIVE

## 2021-04-09 LAB — ACETAMINOPHEN LEVEL: Acetaminophen (Tylenol), Serum: <10 ug/mL — ABNORMAL LOW (ref 10–30)

## 2021-04-09 MED ORDER — ACETAMINOPHEN 500 MG PO TABS
1000.0000 mg | ORAL_TABLET | Freq: Once | ORAL | Status: AC
  Administered 2021-04-09: 1000 mg via ORAL
  Filled 2021-04-09: qty 2
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
## 2021-04-09 NOTE — ED Notes (Addendum)
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
## 2021-04-09 NOTE — ED Provider Notes (Addendum)
[facility] Emergency Department Provider Note  Time seen: 4:36 PM  I have reviewed the triage vital signs and the nursing notes.   HISTORY  Chief Complaint Drug Overdose   HPI Carlos Rubio is a 50 y.o. male with a past medical history of hypertension, substance abuse, bipolar, presents to the emergency department after a heroin overdose.  According to the patient he states he has not used heroin for approximately 3 months but relapsed today.  Patient states he used heroin today and was talking to someone that he did not even know next thing he recalls is waking up with an ambulance around him.  Patient received Narcan prior to arrival and on arrival is awake alert and oriented.  Patient denies any medical complaints besides a headache and states he is "tired."  Patient is currently sitting in a stretcher eating pizza.   Past Medical History:  Diagnosis Date   Back pain    Hypertension    Kidney stones    Leg fracture    Low testosterone    Opioid type dependence, abuse (HCC)    Opioid withdrawal [facility])     Patient Active Problem List   Diagnosis Date Noted   Bipolar depression (HCC) 10/03/2017   Hypertension 10/03/2017    Past Surgical History:  Procedure Laterality Date   FEMUR IM NAIL  11/07/2011   Procedure: INTRAMEDULLARY (IM) NAIL FEMORAL;  Surgeon: Venita Lick, MD;  Location: [facility] OR;  Service: Orthopedics;  Laterality: Left;   HERNIA REPAIR      Prior to Admission medications   Medication Sig Start Date End Date Taking? Authorizing Provider  lisinopril (PRINIVIL,ZESTRIL) 20 MG tablet Take 1 tablet (20 mg total) by mouth daily. 10/01/17   Particia Nearing, PA-C  QUEtiapine (SEROQUEL) 100 MG tablet Take 1/2 tab nightly x 1 week, increase if tolerated to full tab nightly 10/01/17   Particia Nearing, PA-C    Allergies  Allergen Reactions   Benadryl [Diphenhydramine]     Hyperactivity    Family History  Problem Relation Age of  Onset   Anxiety disorder Mother    Hypertension Mother    ADD / ADHD Son     Social History Social History   Tobacco Use   Smoking status: Some Days   Smokeless tobacco: Never  Vaping Use   Vaping Use: Never used  Substance Use Topics   Alcohol use: Yes    Comment: Socially   Drug use: Yes    Types: IV    Comment: heroin    Review of Systems Constitutional: Negative for fever. Cardiovascular: Negative for chest pain. Respiratory: Negative for shortness of breath. Gastrointestinal: Negative for abdominal pain Musculoskeletal: Negative for musculoskeletal complaints Neurological: moderate headache All other ROS negative  ____________________________________________   PHYSICAL EXAM:  VITAL SIGNS: ED Triage Vitals [04/09/21 1432]  Enc Vitals Group     BP (!) 161/101     Pulse Rate 100     Resp 18     Temp 98.7 F (37.1 C)     Temp Source Oral     SpO2 100 %     Weight      Height      Head Circumference      Peak Flow      Pain Score      Pain Loc      Pain Edu?      Excl. in GC?    Constitutional: Patient is awake and alert but  is somewhat somnolent, but able to stay awake throughout my evaluation answer questions appropriately.  Currently eating pizza sitting in bed. Eyes: Normal exam ENT      Head: Normocephalic and atraumatic.      Mouth/Throat: Mucous membranes are moist. Cardiovascular: Normal rate, regular rhythm.  Respiratory: Normal respiratory effort without tachypnea nor retractions. Breath sounds are clear Gastrointestinal: Soft and nontender. No distention.  Musculoskeletal: Nontender with normal range of motion in all extremities. Neurologic:  Normal speech and language. No gross focal neurologic deficits  Skin:  Skin is warm, dry and intact.  Psychiatric: Somewhat of a flat affect.  Denies suicidal ideation.  ____________________________________________  INITIAL IMPRESSION / ASSESSMENT AND PLAN / ED COURSE  Pertinent labs & imaging  results that were available during my care of the patient were reviewed by me and considered in my medical decision making (see chart for details).   Patient presents emergency department for what appears to be an accidental heroin overdose.  Patient denies any intentional overdose attempt.  Patient has been in the emergency department 2 hours and 40 minutes, somewhat fatigued appearing but stays awake throughout my evaluation, answers questions appropriately, eating dinner.  Patient denies suicide attempt but states he is "just tired of life" when asked specifically what he means by this he states he is tired of substance abuse and some of his life circumstances, but specifically denies any attempt to hurt himself or thoughts of hurting himself.  I did ask if he would like to speak to a psychiatrist, patient would like to speak to psychiatry but he will remain here voluntary at this time as I do not believe him to be a threat to himself or anybody else currently.  Patient has been seen and evaluated by psychiatry.  They believe the patient would benefit from inpatient therapy/treatment.  Patient remains voluntary.  Lab work is nonrevealing besides amphetamine positive urine drug screen.  COVID-negative.  Carlos Rubio was evaluated in Emergency Department on 04/09/2021 for the symptoms described in the history of present illness. He was evaluated in the context of the global COVID-19 pandemic, which necessitated consideration that the patient might be at risk for infection with the SARS-CoV-2 virus that causes COVID-19. Institutional protocols and algorithms that pertain to the evaluation of patients at risk for COVID-19 are in a state of rapid change based on information released by regulatory bodies including the CDC and federal and state organizations. These policies and algorithms were followed during the patient's care in the ED.  ____________________________________________   FINAL CLINICAL  IMPRESSION(S) / ED DIAGNOSES  Heroin overdose   Minna Antis, MD 04/09/21 2028    Minna Antis, MD 04/09/21 2029

## 2021-04-09 NOTE — ED Notes (Signed)
Patient transferred from ED to [facility] room 6 after screening for contraband. Report received from Selena Batten, RN including Situation, Background, Assessment and Recommendations. Pt oriented to unit including Q15 minute rounds as well as the security cameras for their protection. Patient is alert and oriented, warm and dry in no acute distress. Patient denies HI and AVH. Pt. Encouraged to let this nurse know if needs arise.

## 2021-04-09 NOTE — BH Assessment (Signed)
Comprehensive Clinical Assessment (CCA) Note  04/09/2021 Carlos Rubio [id number]  Chief Complaint: Patient is a 50 year old male presenting to [facility] ED voluntarily. Per triage note Pt comes into the ED via EMS from the side of the road, states someone was driving him to the ER but pulled off and called 911, states he overdosed on heroin, was given 5mg  nasal narcan. Pt is a/ox4 on arrival, ambulatory with a steady gait on arrival..EMS reports multiple unsuccessful attempts for IV access. During assessment patient appears alert and oriented x4, still somewhat impaired but is aware of the situation and how he feels. Patient reports that he has been feeling depressed and used heroin to try and end his life "what else am I going to do when I leave here, I have nothing." Patient reports that he has children and family but no current relationship with them due to his substance use. Patient reports he is currently homeless and staying with a friend "I feel like nobody cares about me." Patient also reports poor sleep and fair appetite. Patient reports that he has been using heroin and has a history of using, he reports "I stopped for a couple of months" but reports that he has been using heroin this week "3 times." Patient continues to report SI, denies HI/AH/VH and does not appear to be responding to any internal or external stimuli.  Per Psyc MD Dr. patient meets criteria for Inpatient Chief Complaint  Patient presents with   Drug Overdose   Depression   Visit Diagnosis: Depression, Opiate Abuse    CCA Screening, Triage and Referral (STR)  Patient Reported Information How did you hear about Toni Amend? Self  Referral name: No data recorded Referral phone number: No data recorded  Whom do you see for routine medical problems? No data recorded Practice/Facility Name: No data recorded Practice/Facility Phone Number: No data recorded Name of Contact: No data recorded Contact Number: No data  recorded Contact Fax Number: No data recorded Prescriber Name: No data recorded Prescriber Address (if known): No data recorded  What Is the Reason for Your Visit/Call Today? Patient presents via EMS due to a drug overdose  How Long Has This Been Causing You Problems? > than 6 months  What Do You Feel Would Help You the Most Today? Treatment for Depression or other mood problem; Alcohol or Drug Use Treatment   Have You Recently Been in Any Inpatient Treatment (Hospital/Detox/Crisis Center/28-Day Program)? No data recorded Name/Location of Program/Hospital:No data recorded How Long Were You There? No data recorded When Were You Discharged? No data recorded  Have You Ever Received Services From [facility] Before? No data recorded Who Do You See at [facility]? No data recorded  Have You Recently Had Any Thoughts About Hurting Yourself? Yes  Are You Planning to Commit Suicide/Harm Yourself At This time? No   Have you Recently Had Thoughts About Hurting Someone [facility]? No  Explanation: No data recorded  Have You Used Any Alcohol or Drugs in the Past 24 Hours? Yes  How Long Ago Did You Use Drugs or Alcohol? No data recorded What Did You Use and How Much? Heroin   Do You Currently Have a Therapist/Psychiatrist? No  Name of Therapist/Psychiatrist: No data recorded  Have You Been Recently Discharged From Any Office Practice or Programs? No  Explanation of Discharge From Practice/Program: No data recorded    CCA Screening Triage Referral Assessment Type of Contact: Face-to-Face  Is this Initial or Reassessment? No data recorded  Date Telepsych consult ordered in [facility]:  No data recorded Time Telepsych consult ordered in [facility]:  No data recorded  Patient Reported Information Reviewed? No data recorded Patient Left Without Being Seen? No data recorded Reason for Not Completing Assessment: No data recorded  Collateral Involvement: No data recorded  Does Patient Have a Court  Appointed Legal Guardian? No data recorded Name and Contact of Legal Guardian: No data recorded If Minor and Not Living with Parent(s), Who has Custody? No data recorded Is CPS involved or ever been involved? Never  Is APS involved or ever been involved? Never   Patient Determined To Be At Risk for Harm To Self or Others Based on Review of Patient Reported Information or Presenting Complaint? Yes, for Self-Harm  Method: No data recorded Availability of Means: No data recorded Intent: No data recorded Notification Required: No data recorded Additional Information for Danger to Others Potential: No data recorded Additional Comments for Danger to Others Potential: No data recorded Are There Guns or Other Weapons in Your Home? No data recorded Types of Guns/Weapons: No data recorded Are These Weapons Safely Secured?                            No data recorded Who Could Verify You Are Able To Have These Secured: No data recorded Do You Have any Outstanding Charges, Pending Court Dates, Parole/Probation? No data recorded Contacted To Inform of Risk of Harm To Self or Others: No data recorded  Location of Assessment: [facility] ED   Does Patient Present under Involuntary Commitment? No  IVC Papers Initial File Date: No data recorded  [place] of Residence: [place]   Patient Currently Receiving the Following Services: No data recorded  Determination of Need: Emergent (2 hours)   Options For Referral: No data recorded    CCA Biopsychosocial Intake/Chief Complaint:  No data recorded Current Symptoms/Problems: No data recorded  Patient Reported Schizophrenia/Schizoaffective Diagnosis in Past: No   Strengths: Patient is able to communicate  Preferences: No data recorded Abilities: No data recorded  Type of Services Patient Feels are Needed: No data recorded  Initial Clinical Notes/Concerns: No data recorded  Mental Health Symptoms Depression:   Change in energy/activity;  Hopelessness; Increase/decrease in appetite; Sleep (too much or little); Worthlessness   Duration of Depressive symptoms:  Greater than two weeks   Mania:   None   Anxiety:    Worrying   Psychosis:   None   Duration of Psychotic symptoms: No data recorded  Trauma:   None   Obsessions:   None   Compulsions:   None   Inattention:   None   Hyperactivity/Impulsivity:   None   Oppositional/Defiant Behaviors:   None   Emotional Irregularity:   None   Other Mood/Personality Symptoms:  No data recorded   Mental Status Exam Appearance and self-care  Stature:   Average   Weight:   Average weight   Clothing:   Disheveled   Grooming:   Neglected   Cosmetic use:   None   Posture/gait:   Normal   Motor activity:   Not Remarkable   Sensorium  Attention:   Normal   Concentration:   Normal   Orientation:   X5   Recall/memory:   Normal   Affect and Mood  Affect:   Depressed   Mood:   Depressed   Relating  Eye contact:   Normal   Facial expression:   Responsive  Attitude toward examiner:   Cooperative   Thought and Language  Speech flow:  Slurred   Thought content:   Appropriate to Mood and Circumstances   Preoccupation:   None   Hallucinations:   None   Organization:  No data recorded  [employer] ServicesExecutive Functions  Fund of Knowledge:   Fair   Intelligence:   Average   Abstraction:   Normal   Judgement:   Fair   Dance movement psychotherapisteality Testing:   Adequate   Insight:   Lacking   Decision Making:   Impulsive   Social Functioning  Social Maturity:   Isolates   Social Judgement:   "Chief of Stafftreet Smart"   Stress  Stressors:   Housing; Office managerinancial   Coping Ability:   Contractorxhausted   Skill Deficits:   None   Supports:   Support needed     Religion: Religion/Spirituality Are You A Religious Person?: No  Leisure/Recreation: Leisure / Recreation Do You Have Hobbies?: No  Exercise/Diet: Exercise/Diet Do You Exercise?:  No Have You Gained or Lost A Significant Amount of Weight in the Past Six Months?: No Do You Follow a Special Diet?: No Do You Have Any Trouble Sleeping?: Yes Explanation of Sleeping Difficulties: Patient reports a lack of sleep   CCA Employment/Education Employment/Work Situation: Employment / Work Situation Employment Situation: Unemployed Has Patient ever Been in Equities traderthe Military?: No  Education: Education Is Patient Currently Attending School?: No Did You Have An Individualized Education Program (IIEP): No Did You Have Any Difficulty At Progress EnergySchool?: No Patient's Education Has Been Impacted by Current Illness: No   CCA Family/Childhood History Family and Relationship History: Family history Marital status: Single Does patient have children?: Yes How many children?:  (Unknown) How is patient's relationship with their children?: Patient currently has no relationship with his children  Childhood History:  Childhood History Did patient suffer any verbal/emotional/physical/sexual abuse as a child?: No Did patient suffer from severe childhood neglect?: No Has patient ever been sexually abused/assaulted/raped as an adolescent or adult?: No Was the patient ever a victim of a crime or a disaster?: No Witnessed domestic violence?: No Has patient been affected by domestic violence as an adult?: No  Child/Adolescent Assessment:     CCA Substance Use Alcohol/Drug Use: Alcohol / Drug Use Pain Medications: See MAR Prescriptions: See MAR Over the Counter: See MAR History of alcohol / drug use?: Yes Withdrawal Symptoms: Blackouts, Change in blood pressure Substance #1 Name of Substance 1: Heroin 1 - Amount (size/oz): Unknown 1 - Frequency: "every other day this week" 1 - Last Use / Amount: 04/09/21 1- Route of Use: Injection                       ASAM's:  Six Dimensions of Multidimensional Assessment  Dimension 1:  Acute Intoxication and/or Withdrawal Potential:       Dimension 2:  Biomedical Conditions and Complications:      Dimension 3:  Emotional, Behavioral, or Cognitive Conditions and Complications:     Dimension 4:  Readiness to Change:     Dimension 5:  Relapse, Continued use, or Continued Problem Potential:     Dimension 6:  Recovery/Living Environment:     ASAM Severity Score:    ASAM Recommended Level of Treatment:     Substance use Disorder (SUD) Substance Use Disorder (SUD)  Checklist Symptoms of Substance Use: Continued use despite having a persistent/recurrent physical/psychological problem caused/exacerbated by use, Continued use despite persistent or recurrent social, interpersonal problems, caused or exacerbated  by use, Presence of craving or strong urge to use, Recurrent use that results in a failure to fulfill major role obligations (work, school, home), Repeated use in physically hazardous situations, Persistent desire or unsuccessful efforts to cut down or control use, Social, occupational, recreational activities given up or reduced due to use  Recommendations for Services/Supports/Treatments:  Inpatient  DSM5 Diagnoses: Patient Active Problem List   Diagnosis Date Noted   Severe major depression, single episode, without psychotic features (HCC) 04/09/2021   Opiate abuse, continuous (HCC) 04/09/2021   Bipolar depression (HCC) 10/03/2017   Hypertension 10/03/2017    Patient Centered Plan: Patient is on the following Treatment Plan(s):  Depression and Substance Abuse   Referrals to Alternative Service(s): Referred to Alternative Service(s):   Place:   Date:   Time:    Referred to Alternative Service(s):   Place:   Date:   Time:    Referred to Alternative Service(s):   Place:   Date:   Time:    Referred to Alternative Service(s):   Place:   Date:   Time:     Tinleigh Whitmire A Stepahnie Campo, LCAS-A

## 2021-04-10 ENCOUNTER — Inpatient Hospital Stay
Admission: RE | Admit: 2021-04-10 | Discharge: 2021-04-14 | DRG: 885 | Disposition: A | Discharge status: Home / Self Care | Payer: 59 | Source: Intra-hospital | Attending: Behavioral Health | Admitting: Behavioral Health

## 2021-04-10 ENCOUNTER — Other Ambulatory Visit: Payer: Self-pay

## 2021-04-10 DIAGNOSIS — R45851 Suicidal ideations: Secondary | ICD-10-CM | POA: Diagnosis present

## 2021-04-10 DIAGNOSIS — Z818 Family history of other mental and behavioral disorders: Secondary | ICD-10-CM

## 2021-04-10 DIAGNOSIS — G4700 Insomnia, unspecified: Secondary | ICD-10-CM | POA: Diagnosis present

## 2021-04-10 DIAGNOSIS — F332 Major depressive disorder, recurrent severe without psychotic features: Principal | ICD-10-CM | POA: Diagnosis present

## 2021-04-10 DIAGNOSIS — Z888 Allergy status to other drugs, medicaments and biological substances status: Secondary | ICD-10-CM | POA: Diagnosis not present

## 2021-04-10 DIAGNOSIS — F17210 Nicotine dependence, cigarettes, uncomplicated: Secondary | ICD-10-CM | POA: Diagnosis present

## 2021-04-10 DIAGNOSIS — F1113 Opioid abuse with withdrawal: Secondary | ICD-10-CM | POA: Diagnosis not present

## 2021-04-10 DIAGNOSIS — I10 Essential (primary) hypertension: Secondary | ICD-10-CM | POA: Diagnosis present

## 2021-04-10 DIAGNOSIS — Z91041 Radiographic dye allergy status: Secondary | ICD-10-CM

## 2021-04-10 DIAGNOSIS — F322 Major depressive disorder, single episode, severe without psychotic features: Secondary | ICD-10-CM | POA: Diagnosis present

## 2021-04-10 DIAGNOSIS — Z79899 Other long term (current) drug therapy: Secondary | ICD-10-CM

## 2021-04-10 DIAGNOSIS — F1110 Opioid abuse, uncomplicated: Secondary | ICD-10-CM | POA: Diagnosis present

## 2021-04-10 MED ORDER — MAGNESIUM HYDROXIDE 400 MG/5ML PO SUSP: 30.0000 mL | Freq: Every day | ORAL | Status: DC | PRN

## 2021-04-10 MED ORDER — ALUM & MAG HYDROXIDE-SIMETH 200-200-20 MG/5ML PO SUSP: 30.0000 mL | ORAL | Status: DC | PRN

## 2021-04-10 MED ORDER — LISINOPRIL 20 MG PO TABS
20.0000 mg | ORAL_TABLET | Freq: Every day | ORAL | Status: DC
  Administered 2021-04-10 – 2021-04-12 (×3): 20 mg via ORAL
  Filled 2021-04-10 (×3): qty 1

## 2021-04-10 MED ORDER — ACETAMINOPHEN 325 MG PO TABS: 650.0000 mg | ORAL_TABLET | Freq: Four times a day (QID) | ORAL | Status: DC | PRN

## 2021-04-10 MED ORDER — QUETIAPINE FUMARATE 100 MG PO TABS
100.0000 mg | ORAL_TABLET | Freq: Every day | ORAL | Status: DC
  Administered 2021-04-12 – 2021-04-13 (×2): 100 mg via ORAL
  Filled 2021-04-10 (×3): qty 1

## 2021-04-10 NOTE — Progress Notes (Signed)
Patient did present Anxious but Cooperative during admission. Patient denies SI/HI/AVH but Verbalized regrets about drug relapse triggered from loss of family members. Patient disheveled and presents admission focused verbalized need for treatment plan.  Skin searched completed by RNs x2. Vital signs assessed. BP medication given. Patient preferred to reposition and rest for pain at present. Oriented to unit rules, policies, procedures. Cont to q15 minute check for safety.

## 2021-04-10 NOTE — Tx Team (Signed)
Initial Treatment Plan 04/10/2021 6:55 PM [name] YIR:[id number]    PATIENT STRESSORS: Financial difficulties Loss of wife   PATIENT STRENGTHS: Motivation for treatment/growth Special hobby/interest Supportive family/friends   PATIENT IDENTIFIED PROBLEMS: Depression   Drug abuse                   DISCHARGE CRITERIA:  Improved stabilization in mood, thinking, and/or behavior Motivation to continue treatment in a less acute level of care Safe-care adequate arrangements made  PRELIMINARY DISCHARGE PLAN: Return to previous living arrangement  PATIENT/FAMILY INVOLVEMENT: This treatment plan has been presented to and reviewed with the patient, Carlos Rubio.  The patient has been given the opportunity to ask questions and make suggestions.  [address], [place] 04/10/2021, 6:55 PM

## 2021-04-10 NOTE — ED Notes (Signed)
Pt discharged to [facility] voluntarily.  Belongings sent with patient.  Pt cooperative.

## 2021-04-10 NOTE — ED Notes (Signed)
VOL/ Admit to BMU after shift change

## 2021-04-10 NOTE — BH Assessment (Signed)
PATIENT BED AVAILABLE AFTER 8:30AM  Patient is to be admitted to [facility] by Psychiatric Nurse Practitioner Gillermo Murdoch.  Attending Physician will be Dr. Neale Burly.   Patient has been assigned to room 307, by [facility] Charge Nurse [place].   Intake Paper Work has been signed and placed on patient chart.  ER staff is aware of the admission: [facility] ER Secretary   Dr. Elesa Massed, ER MD  Thayer Ohm Patient's Nurse  Marylene Land Patient Access.

## 2021-04-10 NOTE — ED Notes (Signed)
Pt asleep at this time, unable to collect vitals. Will collect pt vitals once awake. 

## 2021-04-10 NOTE — Progress Notes (Signed)
Patient is alert and oriented x 3, he appears agitated , mood is labile, he forwards for little information, her  isolates to his room, not receptive to staff. Patient refused night time medication. He stated " I don't take nigh medicine" 15 minutes safety checks maintained will continue to monitor

## 2021-04-11 DIAGNOSIS — F332 Major depressive disorder, recurrent severe without psychotic features: Secondary | ICD-10-CM | POA: Diagnosis not present

## 2021-04-11 MED ORDER — ZIPRASIDONE MESYLATE 20 MG IM SOLR: 20.0000 mg | Freq: Four times a day (QID) | INTRAMUSCULAR | Status: DC | PRN

## 2021-04-11 MED ORDER — OLANZAPINE 10 MG PO TABS
10.0000 mg | ORAL_TABLET | Freq: Four times a day (QID) | ORAL | Status: DC | PRN
  Administered 2021-04-11: 10 mg via ORAL
  Filled 2021-04-11: qty 1

## 2021-04-11 NOTE — Progress Notes (Signed)
Recreation Therapy Notes  Date: 04/11/2021  Time: 9:30 am   Location: Courtyard   Behavioral response: N/A   Intervention Topic: Leisure   Discussion/Intervention: Patient did not attend group.   Clinical Observations/Feedback:  Patient did not attend group.   Devonia Farro LRT/CTRS         Pallie Swigert 04/11/2021 11:50 AM

## 2021-04-11 NOTE — H&P (Signed)
Psychiatric Admission Assessment Adult  Patient Identification: Carlos Rubio MRN:  [id number] Date of Evaluation:  04/11/2021 Chief Complaint:  MDD (major depressive disorder), recurrent episode, severe (HCC) [F33.2] Principal Diagnosis: MDD (major depressive disorder), recurrent episode, severe (HCC) Diagnosis:  Principal Problem:   MDD (major depressive disorder), recurrent episode, severe (HCC) Active Problems:   Hypertension   Opiate abuse, continuous (HCC)  CC "I just want to go see my family, and then go to [facility]."  History of Present Illness: 50 year old male presenting for worsening depression and suicidal ideations. He was found on the side of the road passed out, and required Narcan for revival. He initially said this was an accidental overdose after relapse, but then later informed staff this was a suicide attempt. While in the ED he endorsed worsening depression, no desire to live, and guilt over being a burden to his family. Today when seen one-on-one he is undergoing active withdrawals and greatly minimizing his suicidal statements. He states he would like to go home today, and then go to [place] later. Given active withdrawals and recent suicide attempt, I do not feel this plan is safe. Will continue hospitalization for the time being to address mood and detox.  Associated Signs/Symptoms: Depression Symptoms:  depressed mood, anhedonia, insomnia, feelings of worthlessness/guilt, difficulty concentrating, hopelessness, suicidal thoughts with specific plan, suicidal attempt, Duration of Depression Symptoms: Greater than two weeks  (Hypo) Manic Symptoms:  Impulsivity, Anxiety Symptoms:  Excessive Worry, Psychotic Symptoms:   Denies PTSD Symptoms: Negative Total Time spent with patient: 1 hour  Past Psychiatric History: History of opioid use disorder. Bipolar disorder also listed in the chart, though patient cannot describe any hypomanic or manic episodes. He denies ever  seeing any mental health providers in the past.   Is the patient at risk to self? Yes.    Has the patient been a risk to self in the past 6 months? No.  Has the patient been a risk to self within the distant past? No.  Is the patient a risk to others? No.  Has the patient been a risk to others in the past 6 months? No.  Has the patient been a risk to others within the distant past? No.   Prior Inpatient Therapy:   Prior Outpatient Therapy:    Alcohol Screening: Patient refused Alcohol Screening Tool: Yes 1. How often do you have a drink containing alcohol?: Never 2. How many drinks containing alcohol do you have on a typical day when you are drinking?: 1 or 2 3. How often do you have six or more drinks on one occasion?: Never AUDIT-C Score: 0 4. How often during the last year have you found that you were not able to stop drinking once you had started?: Never 5. How often during the last year have you failed to do what was normally expected from you because of drinking?: Never 6. How often during the last year have you needed a first drink in the morning to get yourself going after a heavy drinking session?: Never 7. How often during the last year have you had a feeling of guilt of remorse after drinking?: Never 8. How often during the last year have you been unable to remember what happened the night before because you had been drinking?: Never 9. Have you or someone else been injured as a result of your drinking?: No 10. Has a relative or friend or a doctor or another health worker been concerned about your drinking or suggested you  cut down?: No Alcohol Use Disorder Identification Test Final Score (AUDIT): 0 Substance Abuse History in the last 12 months:  Yes.   Consequences of Substance Abuse: Worsening mental health Previous Psychotropic Medications: No  Psychological Evaluations: No  Past Medical History:  Past Medical History:  Diagnosis Date   Back pain    Hypertension     Kidney stones    Leg fracture    Low testosterone    Opioid type dependence, abuse (HCC)    Opioid withdrawal (HCC)     Past Surgical History:  Procedure Laterality Date   FEMUR IM NAIL  11/07/2011   Procedure: INTRAMEDULLARY (IM) NAIL FEMORAL;  Surgeon: Venita Lick, MD;  Location: [facility] OR;  Service: Orthopedics;  Laterality: Left;   HERNIA REPAIR     Family History:  Family History  Problem Relation Age of Onset   Anxiety disorder Mother    Hypertension Mother    ADD / ADHD Son    Family Psychiatric  History: Mother with anxiety, son with ADHD. Family history of substance abuse  Tobacco Screening:   Social History:  Social History   Substance and Sexual Activity  Alcohol Use Yes   Comment: Socially     Social History   Substance and Sexual Activity  Drug Use Yes   Types: IV, Methamphetamines, Heroin   Comment: heroin    Additional Social History:                           Allergies:   Allergies  Allergen Reactions   Benadryl [Diphenhydramine]     Hyperactivity   Diphenhydramine Hcl Other (See Comments)    Elevated BP   Iodinated Diagnostic Agents Other (See Comments)    "paralyzed, couldn't move or breathe"   Lab Results:  Results for orders placed or performed during the hospital encounter of 04/09/21 (from the past 48 hour(s))  Urine Drug Screen, Qualitative     Status: Abnormal   Collection Time: 04/09/21  2:20 PM  Result Value Ref Range   Tricyclic, Ur Screen NONE DETECTED NONE DETECTED   Amphetamines, Ur Screen POSITIVE (A) NONE DETECTED   MDMA (Ecstasy)Ur Screen NONE DETECTED NONE DETECTED   Cocaine Metabolite,Ur [place] NONE DETECTED NONE DETECTED   Opiate, Ur Screen NONE DETECTED NONE DETECTED   Phencyclidine (PCP) Ur S NONE DETECTED NONE DETECTED   Cannabinoid 50 Ng, Ur [place] NONE DETECTED NONE DETECTED   Barbiturates, Ur Screen NONE DETECTED NONE DETECTED   Benzodiazepine, Ur Scrn NONE DETECTED NONE DETECTED   Methadone Scn, Ur NONE DETECTED  NONE DETECTED    Comment: (NOTE) Tricyclics + metabolites, urine    Cutoff 1000 ng/mL Amphetamines + metabolites, urine  Cutoff 1000 ng/mL MDMA (Ecstasy), urine              Cutoff 500 ng/mL Cocaine Metabolite, urine          Cutoff 300 ng/mL Opiate + metabolites, urine        Cutoff 300 ng/mL Phencyclidine (PCP), urine         Cutoff 25 ng/mL Cannabinoid, urine                 Cutoff 50 ng/mL Barbiturates + metabolites, urine  Cutoff 200 ng/mL Benzodiazepine, urine              Cutoff 200 ng/mL Methadone, urine  Cutoff 300 ng/mL  The urine drug screen provides only a preliminary, unconfirmed analytical test result and should not be used for non-medical purposes. Clinical consideration and professional judgment should be applied to any positive drug screen result due to possible interfering substances. A more specific alternate chemical method must be used in order to obtain a confirmed analytical result. Gas chromatography / mass spectrometry (GC/MS) is the preferred confirm atory method. Performed at [facility], [address] [place], [place] [address]   Comprehensive metabolic panel     Status: Abnormal   Collection Time: 04/09/21  2:46 PM  Result Value Ref Range   Sodium 139 135 - 145 mmol/L   Potassium 3.5 3.5 - 5.1 mmol/L   Chloride 101 98 - 111 mmol/L   CO2 29 22 - 32 mmol/L   Glucose, Bld 52 (L) 70 - 99 mg/dL    Comment: Glucose reference range applies only to samples taken after fasting for at least 8 hours.   BUN 18 6 - 20 mg/dL   Creatinine, Ser 6.04 0.61 - 1.24 mg/dL   Calcium 9.4 8.9 - 54.0 mg/dL   Total Protein 8.6 (H) 6.5 - 8.1 g/dL   Albumin 4.8 3.5 - 5.0 g/dL   AST 21 15 - 41 U/L   ALT 14 0 - 44 U/L   Alkaline Phosphatase 87 38 - 126 U/L   Total Bilirubin 0.5 0.3 - 1.2 mg/dL   GFR, Estimated >98 >11 mL/min    Comment: (NOTE) Calculated using the CKD-EPI Creatinine Equation (2021)    Anion gap 9 5 - 15    Comment: Performed  at [facility], [address] [place], [place] [address]  Ethanol     Status: None   Collection Time: 04/09/21  2:46 PM  Result Value Ref Range   Alcohol, Ethyl (B) <10 <10 mg/dL    Comment: (NOTE) Lowest detectable limit for serum alcohol is 10 mg/dL.  For medical purposes only. Performed at [facility], [address] [place], [place] [address]   Salicylate level     Status: Abnormal   Collection Time: 04/09/21  2:46 PM  Result Value Ref Range   Salicylate Lvl <7.0 (L) 7.0 - 30.0 mg/dL    Comment: Performed at [facility], [address] [place], [place] [address]  Acetaminophen level     Status: Abnormal   Collection Time: 04/09/21  2:46 PM  Result Value Ref Range   Acetaminophen (Tylenol), Serum <10 (L) 10 - 30 ug/mL    Comment: (NOTE) Therapeutic concentrations vary significantly. A range of 10-30 ug/mL  may be an effective concentration for many patients. However, some  are best treated at concentrations outside of this range. Acetaminophen concentrations >150 ug/mL at 4 hours after ingestion  and >50 ug/mL at 12 hours after ingestion are often associated with  toxic reactions.  Performed at [facility], [address] [place], [place] [address]   cbc     Status: None   Collection Time: 04/09/21  2:46 PM  Result Value Ref Range   WBC 10.5 4.0 - 10.5 K/uL   RBC 4.61 4.22 - 5.81 MIL/uL   Hemoglobin 13.9 13.0 - 17.0 g/dL   HCT 96.2 95.2 - 84.1 %   MCV 85.0 80.0 - 100.0 fL   MCH 30.2 26.0 - 34.0 pg   MCHC 35.5 30.0 - 36.0 g/dL   RDW 32.4 40.1 - 02.7 %   Platelets 278 150 - 400 K/uL  nRBC 0.0 0.0 - 0.2 %    Comment: Performed at [facility], [address] [place], [place] [address]  Resp Panel by RT-PCR (Flu A&B, Covid) Nasopharyngeal Swab     Status: None   Collection Time: 04/09/21  7:15 PM   Specimen: Nasopharyngeal Swab; Nasopharyngeal(NP) swabs in vial transport medium  Result Value Ref Range   SARS  Coronavirus 2 by RT PCR NEGATIVE NEGATIVE    Comment: (NOTE) SARS-CoV-2 target nucleic acids are NOT DETECTED.  The SARS-CoV-2 RNA is generally detectable in upper respiratory specimens during the acute phase of infection. The lowest concentration of SARS-CoV-2 viral copies this assay can detect is 138 copies/mL. A negative result does not preclude SARS-Cov-2 infection and should not be used as the sole basis for treatment or other patient management decisions. A negative result may occur with  improper specimen collection/handling, submission of specimen other than nasopharyngeal swab, presence of viral mutation(s) within the areas targeted by this assay, and inadequate number of viral copies(<138 copies/mL). A negative result must be combined with clinical observations, patient history, and epidemiological information. The expected result is Negative.  Fact Sheet for Patients:  BloggerCourse.comhttps://www.fda.gov/media/152166/download  Fact Sheet for Healthcare Providers:  SeriousBroker.ithttps://www.fda.gov/media/152162/download  This test is no t yet approved or cleared by the [place] FDA and  has been authorized for detection and/or diagnosis of SARS-CoV-2 by FDA under an Emergency Use Authorization (EUA). This EUA will remain  in effect (meaning this test can be used) for the duration of the COVID-19 declaration under Section 564(b)(1) of the Act, 21 U.S.C.section 360bbb-3(b)(1), unless the authorization is terminated  or revoked sooner.       Influenza A by PCR NEGATIVE NEGATIVE   Influenza B by PCR NEGATIVE NEGATIVE    Comment: (NOTE) The Xpert Xpress SARS-CoV-2/FLU/RSV plus assay is intended as an aid in the diagnosis of influenza from Nasopharyngeal swab specimens and should not be used as a sole basis for treatment. Nasal washings and aspirates are unacceptable for Xpert Xpress SARS-CoV-2/FLU/RSV testing.  Fact Sheet for Patients: BloggerCourse.comhttps://www.fda.gov/media/152166/download  Fact Sheet  for Healthcare Providers: SeriousBroker.ithttps://www.fda.gov/media/152162/download  This test is not yet approved or cleared by the [place] FDA and has been authorized for detection and/or diagnosis of SARS-CoV-2 by FDA under an Emergency Use Authorization (EUA). This EUA will remain in effect (meaning this test can be used) for the duration of the COVID-19 declaration under Section 564(b)(1) of the Act, 21 U.S.C. section 360bbb-3(b)(1), unless the authorization is terminated or revoked.  Performed at [facility], [address] [place], [place] [address]     Blood Alcohol level:  Lab Results  Component Value Date   ETH <10 04/09/2021   ETH 165 (H) 11/07/2011    Metabolic Disorder Labs:  No results found for: HGBA1C, MPG No results found for: PROLACTIN No results found for: CHOL, TRIG, HDL, CHOLHDL, VLDL, LDLCALC  Current Medications: Current Facility-Administered Medications  Medication Dose Route Frequency Provider Last Rate Last Admin   acetaminophen (TYLENOL) tablet 650 mg  650 mg Oral Q6H PRN Gillermo Murdochhompson, Jacqueline, NP       alum & mag hydroxide-simeth (MAALOX/MYLANTA) 200-200-20 MG/5ML suspension 30 mL  30 mL Oral Q4H PRN Gillermo Murdochhompson, Jacqueline, NP       lisinopril (ZESTRIL) tablet 20 mg  20 mg Oral Daily Gillermo Murdochhompson, Jacqueline, NP   20 mg at 04/11/21 [id number]   magnesium hydroxide (MILK OF MAGNESIA) suspension 30 mL  30 mL Oral Daily PRN Gillermo Murdochhompson, Jacqueline, NP  QUEtiapine (SEROQUEL) tablet 100 mg  100 mg Oral QHS Gillermo Murdoch, NP       PTA Medications: Medications Prior to Admission  Medication Sig Dispense Refill Last Dose   lisinopril (PRINIVIL,ZESTRIL) 20 MG tablet Take 1 tablet (20 mg total) by mouth daily. (Patient not taking: Reported on 04/10/2021) 90 tablet 1    QUEtiapine (SEROQUEL) 100 MG tablet Take 1/2 tab nightly x 1 week, increase if tolerated to full tab nightly (Patient not taking: Reported on 04/10/2021) 30 tablet 0      Musculoskeletal: Strength & Muscle Tone: within normal limits Gait & Station: normal Patient leans: N/A            Psychiatric Specialty Exam:  Presentation  General Appearance: Casual  Eye Contact:Fair  Speech:Clear and Coherent; Normal Rate  Speech Volume:Normal  Handedness:Right   Mood and Affect  Mood:Anxious  Affect:Congruent   Thought Process  Thought Processes:Coherent  Duration of Psychotic Symptoms: No data recorded Past Diagnosis of Schizophrenia or Psychoactive disorder: No  Descriptions of Associations:Intact  Orientation:Full (Time, Place and Person)  Thought Content:Logical  Hallucinations:Hallucinations: None  Ideas of Reference:None  Suicidal Thoughts:Suicidal Thoughts: No  Homicidal Thoughts:Homicidal Thoughts: No   Sensorium  Memory:Immediate Fair; Recent Fair; Remote Fair  Judgment:Impaired  Insight:Lacking   Executive Functions  Concentration:Fair  Attention Span:Fair  Recall:Fair  Fund of Knowledge:Fair  Language:Fair   Psychomotor Activity  Psychomotor Activity:Psychomotor Activity: Normal   Assets  Assets:Communication Skills; Social Support   Sleep  Sleep:Sleep: Good Number of Hours of Sleep: 8    Physical Exam: Physical Exam Vitals and nursing note reviewed.  Constitutional:      Appearance: Normal appearance.  HENT:     Head: Normocephalic and atraumatic.     Right Ear: External ear normal.     Left Ear: External ear normal.     Nose: Nose normal.     Mouth/Throat:     Mouth: Mucous membranes are moist.     Pharynx: Oropharynx is clear.  Eyes:     Extraocular Movements: Extraocular movements intact.     Conjunctiva/sclera: Conjunctivae normal.     Pupils: Pupils are equal, round, and reactive to light.  Cardiovascular:     Rate and Rhythm: Normal rate.     Pulses: Normal pulses.  Pulmonary:     Effort: Pulmonary effort is normal.     Breath sounds: Normal breath sounds.   Abdominal:     General: Abdomen is flat.  Musculoskeletal:        General: No swelling. Normal range of motion.     Cervical back: Normal range of motion and neck supple.  Skin:    General: Skin is warm and dry.  Neurological:     General: No focal deficit present.     Mental Status: He is alert and oriented to person, place, and time.  Psychiatric:        Attention and Perception: He is inattentive.        Mood and Affect: Mood is anxious.        Speech: Speech normal.        Behavior: Behavior is agitated.        Thought Content: Thought content does not include homicidal or suicidal ideation.        Cognition and Memory: Cognition is impaired. Memory is impaired.        Judgment: Judgment is impulsive.   Review of Systems  Constitutional:  Positive for malaise/fatigue. Negative for fever.  HENT:  Negative.    Eyes: Negative.   Cardiovascular: Negative.   Gastrointestinal: Negative.   Genitourinary: Negative.   Musculoskeletal:  Positive for back pain, joint pain and myalgias.  Skin: Negative.   Neurological: Negative.   Endo/Heme/Allergies:  Positive for environmental allergies. Does not bruise/bleed easily.  Psychiatric/Behavioral:  Positive for depression and substance abuse. The patient is nervous/anxious.   Blood pressure (!) 144/98, pulse 76, temperature 98.1 F (36.7 C), temperature source Oral, resp. rate 18, height 5\' 6"  (1.676 m), weight 62.6 kg, SpO2 97 %. Body mass index is 22.27 kg/m.  Treatment Plan Summary: Daily contact with patient to assess and evaluate symptoms and progress in treatment and Medication managementPatient presenting for worsening depression and suicide attempt via heroin ingestion. Today he is now minimizing these statements and attempt. He appears to be actively withdrawing, and showing poor judgement. Continue seroquel 100 mg nightly for mood. PRNs in place for agitation. Will offer patient substance abuse resources during his stay.    Observation Level/Precautions:  Detox  Laboratory:   lipid panel, hemoglobin a1c  Psychotherapy:    Medications:    Consultations:    Discharge Concerns:    Estimated LOS:  Other:     Physician Treatment Plan for Primary Diagnosis: MDD (major depressive disorder), recurrent episode, severe (HCC) Long Term Goal(s): Improvement in symptoms so as ready for discharge  Short Term Goals: Ability to identify changes in lifestyle to reduce recurrence of condition will improve, Ability to verbalize feelings will improve, Ability to disclose and discuss suicidal ideas, Ability to demonstrate self-control will improve, Ability to identify and develop effective coping behaviors will improve, Ability to maintain clinical measurements within normal limits will improve, Compliance with prescribed medications will improve, and Ability to identify triggers associated with substance abuse/mental health issues will improve  Physician Treatment Plan for Secondary Diagnosis: Principal Problem:   MDD (major depressive disorder), recurrent episode, severe (HCC) Active Problems:   Hypertension   Opiate abuse, continuous (HCC)  Long Term Goal(s): Improvement in symptoms so as ready for discharge  Short Term Goals: Ability to identify changes in lifestyle to reduce recurrence of condition will improve, Ability to verbalize feelings will improve, Ability to disclose and discuss suicidal ideas, Ability to demonstrate self-control will improve, Ability to identify and develop effective coping behaviors will improve, Ability to maintain clinical measurements within normal limits will improve, Compliance with prescribed medications will improve, and Ability to identify triggers associated with substance abuse/mental health issues will improve  I certify that inpatient services furnished can reasonably be expected to improve the patient's condition.    , MD 8/26/202212:18 PM

## 2021-04-11 NOTE — [facility] Counselor (Signed)
CSW spoke with Carlos Rubio, pt has an intake scheduled for 04/14/2021 at 1:00PM.  [facility] [phone]  Penni Homans, MSW, LCSW 04/11/2021 1:02 PM

## 2021-04-11 NOTE — [facility] Counselor (Signed)
LCSW Group Therapy Note  04/11/2021 3:21 PM  Type of Therapy and Topic:  Group Therapy:  Feelings around Relapse and Recovery  Participation Level:  Did Not Attend   Description of Group:    Patients in this group will discuss emotions they experience before and after a relapse. They will process how experiencing these feelings, or avoidance of experiencing them, relates to having a relapse. Facilitator will guide patients to explore emotions they have related to recovery. Patients will be encouraged to process which emotions are more powerful. They will be guided to discuss the emotional reaction significant others in their lives may have to their relapse or recovery. Patients will be assisted in exploring ways to respond to the emotions of others without this contributing to a relapse.  Therapeutic Goals: Patient will identify two or more emotions that lead to a relapse for them Patient will identify two emotions that result when they relapse Patient will identify two emotions related to recovery Patient will demonstrate ability to communicate their needs through discussion and/or role plays   Summary of Patient Progress: X  Therapeutic Modalities:   Cognitive Behavioral Therapy Solution-Focused Therapy Assertiveness Training Relapse Prevention Therapy   Simona Huh R. Algis Greenhouse, MSW, LCSW, LCAS 04/11/2021 3:21 PM

## 2021-04-11 NOTE — Tx Team (Addendum)
Interdisciplinary Treatment and Diagnostic Plan Update  04/11/2021 Time of Session: 9:00AM Carlos Rubio MRN: [id number]  Principal Diagnosis: <principal problem not specified>  Secondary Diagnoses: Active Problems:   MDD (major depressive disorder), recurrent episode, severe (HCC)   Current Medications:  Current Facility-Administered Medications  Medication Dose Route Frequency Provider Last Rate Last Admin   acetaminophen (TYLENOL) tablet 650 mg  650 mg Oral Q6H PRN Caroline Sauger, NP       alum & mag hydroxide-simeth (MAALOX/MYLANTA) 200-200-20 MG/5ML suspension 30 mL  30 mL Oral Q4H PRN Caroline Sauger, NP       lisinopril (ZESTRIL) tablet 20 mg  20 mg Oral Daily Caroline Sauger, NP   20 mg at 04/11/21 8144   magnesium hydroxide (MILK OF MAGNESIA) suspension 30 mL  30 mL Oral Daily PRN Caroline Sauger, NP       QUEtiapine (SEROQUEL) tablet 100 mg  100 mg Oral QHS Caroline Sauger, NP       PTA Medications: Medications Prior to Admission  Medication Sig Dispense Refill Last Dose   lisinopril (PRINIVIL,ZESTRIL) 20 MG tablet Take 1 tablet (20 mg total) by mouth daily. (Patient not taking: Reported on 04/10/2021) 90 tablet 1    QUEtiapine (SEROQUEL) 100 MG tablet Take 1/2 tab nightly x 1 week, increase if tolerated to full tab nightly (Patient not taking: Reported on 04/10/2021) 30 tablet 0     Patient Stressors: Financial difficulties Loss of wife  Patient Strengths: Motivation for treatment/growth Special hobby/interest Supportive family/friends  Treatment Modalities: Medication Management, Group therapy, Case management,  1 to 1 session with clinician, Psychoeducation, Recreational therapy.   Physician Treatment Plan for Primary Diagnosis: <principal problem not specified> Long Term Goal(s):     Short Term Goals:    Medication Management: Evaluate patient's response, side effects, and tolerance of medication regimen.  Therapeutic Interventions: 1  to 1 sessions, Unit Group sessions and Medication administration.  Evaluation of Outcomes: Not Met  Physician Treatment Plan for Secondary Diagnosis: Active Problems:   MDD (major depressive disorder), recurrent episode, severe ([place])  Long Term Goal(s):     Short Term Goals:       Medication Management: Evaluate patient's response, side effects, and tolerance of medication regimen.  Therapeutic Interventions: 1 to 1 sessions, Unit Group sessions and Medication administration.  Evaluation of Outcomes: Not Met   RN Treatment Plan for Primary Diagnosis: <principal problem not specified> Long Term Goal(s): Knowledge of disease and therapeutic regimen to maintain health will improve  Short Term Goals: Ability to remain free from injury will improve, Ability to verbalize frustration and anger appropriately will improve, Ability to demonstrate self-control, Ability to participate in decision making will improve, Ability to verbalize feelings will improve, Ability to disclose and discuss suicidal ideas, Ability to identify and develop effective coping behaviors will improve, and Compliance with prescribed medications will improve  Medication Management: RN will administer medications as ordered by provider, will assess and evaluate patient's response and provide education to patient for prescribed medication. RN will report any adverse and/or side effects to prescribing provider.  Therapeutic Interventions: 1 on 1 counseling sessions, Psychoeducation, Medication administration, Evaluate responses to treatment, Monitor vital signs and CBGs as ordered, Perform/monitor CIWA, COWS, AIMS and Fall Risk screenings as ordered, Perform wound care treatments as ordered.  Evaluation of Outcomes: Not Met   LCSW Treatment Plan for Primary Diagnosis: <principal problem not specified> Long Term Goal(s): Safe transition to appropriate next level of care at discharge, Engage patient in therapeutic group  addressing interpersonal concerns.  Short Term Goals: Engage patient in aftercare planning with referrals and resources, Increase social support, Increase ability to appropriately verbalize feelings, Increase emotional regulation, Facilitate acceptance of mental health diagnosis and concerns, Facilitate patient progression through stages of change regarding substance use diagnoses and concerns, Identify triggers associated with mental health/substance abuse issues, and Increase skills for wellness and recovery  Therapeutic Interventions: Assess for all discharge needs, 1 to 1 time with Social worker, Explore available resources and support systems, Assess for adequacy in community support network, Educate family and significant other(s) on suicide prevention, Complete Psychosocial Assessment, Interpersonal group therapy.  Evaluation of Outcomes: Not Met   Progress in Treatment: Attending groups: No. Participating in groups: No. Taking medication as prescribed: Yes. Toleration medication: Yes. Family/Significant other contact made: No, will contact:  when given permission.  Patient understands diagnosis: Yes. Discussing patient identified problems/goals with staff: No. Medical problems stabilized or resolved: Yes. Denies suicidal/homicidal ideation: No. Issues/concerns per patient self-inventory: No. Other: none.  New problem(s) identified: No, Describe:  none.  New Short Term/Long Term Goal(s): detox, medication management for mood stabilization; elimination of SI thoughts; development of comprehensive mental wellness/sobriety plan.  Patient Goals: Pt declined participation in treatment team meeting.    Discharge Plan or Barriers: CSW will assist pt with development of an appropriate aftercare/discharge plan.   Reason for Continuation of Hospitalization: Medication stabilization Suicidal ideation Withdrawal symptoms  Estimated Length of Stay: 1-7 days  Recreational  Therapy: Patient Stressors: N/A Patient Goal: Patient will engage in groups without prompting or encouragement from LRT x3 group sessions within 5 recreation therapy group sessions.  Attendees: Patient:  04/11/2021 10:30 AM  Physician: Selina Cooley, MD 04/11/2021 10:30 AM  Nursing: Leeann Must, RN 04/11/2021 10:30 AM  RN Care Manager: 04/11/2021 10:30 AM  Social Worker: Chalmers Guest. Guerry Bruin, MSW, [place], [place] 04/11/2021 10:30 AM  Recreational Therapist: Devin Going, LRT  04/11/2021 10:30 AM  Other: Assunta Curtis, MSW, LCSW 04/11/2021 10:30 AM  Other: Theodosia Quay, RN 04/11/2021 10:30 AM  Other: 04/11/2021 10:30 AM    Scribe for Treatment Team: Shirl Harris, LCSW 04/11/2021 10:30 AM

## 2021-04-11 NOTE — Plan of Care (Signed)
Patient denies SI/HI/AVH and contracts for safety.  Patient presents Anxious and Irritable and denies need for treatment. Patient denies Anxiety and Depression. Patient verbalized "I am not trying to kill myself" but then states "They should have left me in that whole I would have eventually came back".  Patient had minimal participation in therapeutic milieu. Patient did comply with medications scheduled per MD.  Patient denies any adverse reactions from medications administered. Staff offered emotional support and educated pt about importance of compliance with treatment plan.  Routine q 15 min safety checks offered. Will continue to monitor patient for safety on unit during shift.    Problem: Education: Goal: Emotional status will improve Outcome: Not Progressing Goal: Verbalization of understanding the information provided will improve Outcome: Not Progressing

## 2021-04-11 NOTE — Progress Notes (Signed)
Recreation Therapy Notes  INPATIENT RECREATION THERAPY ASSESSMENT  Patient Details Name: Carlos Rubio MRN: [id number] DOB: 1970/12/03 Today's Date: 04/11/2021       Information Obtained From: Patient (Patient refused)  Able to Participate in Assessment/Interview:    Patient Presentation:    Reason for Admission (Per Patient):    Patient Stressors:    Coping Skills:      Leisure Interests (2+):     Frequency of Recreation/Participation:    Awareness of Community Resources:     [employer]:     Current Use:    If no, Barriers?:    Expressed Interest in [employer] Information:    [place] of Residence:     Patient Main Form of Transportation:    Patient Strengths:     Patient Identified Areas of Improvement:     Patient Goal for Hospitalization:     Current SI (including self-harm):     Current HI:     Current AVH:    Staff Intervention Plan:    Consent to Intern Participation:    Ambrose Wile 04/11/2021, 2:33 PM

## 2021-04-11 NOTE — [facility] Suicide Risk Assessment (Signed)
[facility] Admission Suicide Risk Assessment   Nursing information obtained from:  Patient Demographic factors:  Male, Divorced or widowed, Caucasian, Low socioeconomic status Current Mental Status:  NA Loss Factors:  Financial problems / change in socioeconomic status, Loss of significant relationship Historical Factors:  Impulsivity, Anniversary of important loss Risk Reduction Factors:  NA  Total Time spent with patient: 1 hour Principal Problem: MDD (major depressive disorder), recurrent episode, severe (HCC) Diagnosis:  Principal Problem:   MDD (major depressive disorder), recurrent episode, severe (HCC) Active Problems:   Hypertension   Opiate abuse, continuous (HCC)  Subjective Data: 50 year old male presenting for worsening depression and suicidal ideations. He was found on the side of the road passed out, and required Narcan for revival. He initially said this was an accidental overdose after relapse, but then later informed staff this was a suicide attempt. While in the ED he endorsed worsening depression, no desire to live, and guilt over being a burden to his family. Today when seen one-on-one he is undergoing active withdrawals and greatly minimizing his suicidal statements. He states he would like to go home today, and then go to [place] later. Given active withdrawals and recent suicide attempt, I do not feel this plan is safe. Will continue hospitalization for the time being to address mood and detox.   Continued Clinical Symptoms:  Alcohol Use Disorder Identification Test Final Score (AUDIT): 0 The "Alcohol Use Disorders Identification Test", Guidelines for Use in Primary Care, Second Edition.  World Science writer [facility]). Score between 0-7:  no or low risk or alcohol related problems. Score between 8-15:  moderate risk of alcohol related problems. Score between 16-19:  high risk of alcohol related problems. Score 20 or above:  warrants further diagnostic evaluation for alcohol  dependence and treatment.   CLINICAL FACTORS:   Severe Anxiety and/or Agitation Depression:   Comorbid alcohol abuse/dependence Impulsivity Insomnia Alcohol/Substance Abuse/Dependencies   Musculoskeletal: Strength & Muscle Tone: within normal limits Gait & Station: normal Patient leans: N/A  Psychiatric Specialty Exam:  Presentation  General Appearance: Casual  Eye Contact:Fair  Speech:Clear and Coherent; Normal Rate  Speech Volume:Normal  Handedness:Right   Mood and Affect  Mood:Anxious  Affect:Congruent   Thought Process  Thought Processes:Coherent  Descriptions of Associations:Intact  Orientation:Full (Time, Place and Person)  Thought Content:Logical  History of Schizophrenia/Schizoaffective disorder:No  Duration of Psychotic Symptoms:No data recorded Hallucinations:Hallucinations: None  Ideas of Reference:None  Suicidal Thoughts:Suicidal Thoughts: No  Homicidal Thoughts:Homicidal Thoughts: No   Sensorium  Memory:Immediate Fair; Recent Fair; Remote Fair  Judgment:Impaired  Insight:Lacking   Executive Functions  Concentration:Fair  Attention Span:Fair  Recall:Fair  Fund of Knowledge:Fair  Language:Fair   Psychomotor Activity  Psychomotor Activity:Psychomotor Activity: Normal   Assets  Assets:Communication Skills; Social Support   Sleep  Sleep:Sleep: Good Number of Hours of Sleep: 8    Physical Exam: Physical Exam ROS Blood pressure (!) 144/98, pulse 76, temperature 98.1 F (36.7 C), temperature source Oral, resp. rate 18, height 5\' 6"  (1.676 m), weight 62.6 kg, SpO2 97 %. Body mass index is 22.27 kg/m.   COGNITIVE FEATURES THAT CONTRIBUTE TO RISK:  Loss of executive function    SUICIDE RISK:   Moderate:  Frequent suicidal ideation with limited intensity, and duration, some specificity in terms of plans, no associated intent, good self-control, limited dysphoria/symptomatology, some risk factors present, and  identifiable protective factors, including available and accessible social support.  PLAN OF CARE: Continue inpatient hospitalization, see H&P for details.   I certify that  inpatient services furnished can reasonably be expected to improve the patient's condition.   Jesse Sans, MD 04/11/2021, 12:29 PM

## 2021-04-11 NOTE — BHH Suicide Risk Assessment (Signed)
[facility] INPATIENT:  Family/Significant Other Suicide Prevention Education  Suicide Prevention Education:  Patient Refusal for Family/Significant Other Suicide Prevention Education: The patient Carlos Rubio has refused to provide written consent for family/significant other to be provided Family/Significant Other Suicide Prevention Education during admission and/or prior to discharge.  Physician notified.  SPE completed with pt, as pt refused to consent to family contact. SPI pamphlet provided to pt and pt was encouraged to share information with support network, ask questions, and talk about any concerns relating to SPE. Pt denies access to guns/firearms and verbalized understanding of information provided. Mobile Crisis information also provided to pt.   Harden Mo 04/11/2021, 3:45 PM

## 2021-04-11 NOTE — BHH Counselor (Signed)
Adult Comprehensive Assessment  Patient ID: Carlos Rubio, male   DOB: [birth year], 50 y.o.   MRN: [id number]  Information Source: Information source: Patient  Current Stressors:  Patient states their primary concerns and needs for treatment are:: "overdose" Patient states their goals for this hospitilization and ongoing recovery are:: "trying to get some rest so I can think straight" Educational / Learning stressors: Pt denies. Employment / Job issues: Pt denies. Family Relationships: "they don't want anything to do with me" Financial / Lack of resources (include bankruptcy): "I have no money" Housing / Lack of housing: "homeless" Physical health (include injuries & life threatening diseases): Pt denies. Social relationships: Pt denies. Substance abuse: "anything that I can get my hands on, heroin, ICE" Bereavement / Loss: "brother a couple months ago"  Living/Environment/Situation:  Living Arrangements: Other (Comment) Living conditions (as described by patient or guardian): Pt reports that he is homeless, he's been couch surfing and on streets" What is atmosphere in current home: Temporary  Family History:  Marital status: Widowed Widowed, when?: Pt declined to talk about it. Does patient have children?: Yes How many children?: 3 How is patient's relationship with their children?: Patient currently has no relationship with his children.  Childhood History:  By whom was/is the patient raised?: Both parents Description of patient's relationship with caregiver when they were a child: "good" Patient's description of current relationship with people who raised him/her: "here and there" How were you disciplined when you got in trouble as a child/adolescent?: "I wasn't" Does patient have siblings?: Yes Number of Siblings: 2 Description of patient's current relationship with siblings: "don't have one" Did patient suffer any verbal/emotional/physical/sexual abuse as a child?:  (Pt  declined to answer trauma questions.) Did patient suffer from severe childhood neglect?:  (Pt declined to answer trauma questions.) Has patient ever been sexually abused/assaulted/raped as an adolescent or adult?:  (Pt declined to answer trauma questions.) Was the patient ever a victim of a crime or a disaster?:  (Pt declined to answer trauma questions.) Witnessed domestic violence?:  (Pt declined to answer trauma questions.) Has patient been affected by domestic violence as an adult?:  (Pt declined to answer trauma questions.)  Education:  Highest grade of school patient has completed: "9th" ("I got my GED though") Currently a student?: No Learning disability?: No  Employment/Work Situation:   Employment Situation: Unemployed What is the Longest Time Patient has Held a Job?: "15 years" Where was the Patient Employed at that Time?: "[employer]" Has Patient ever Been in the [employer]?: No  Financial Resources:   Financial resources: No income Does patient have a Lawyer or guardian?: No  Alcohol/Substance Abuse:   What has been your use of drugs/alcohol within the last 12 months?: "Heroin: "1-2x week, $5-10 worth, snorting" ; Meth: "smoking, daily, $20-30" If attempted suicide, did drugs/alcohol play a role in this?: No Alcohol/Substance Abuse Treatment Hx: Denies past history Has alcohol/substance abuse ever caused legal problems?: No  Social Support System:   Patient's Community Support System: Good Describe Community Support System: "mom and sister" Type of faith/religion: Pt denies. How does patient's faith help to cope with current illness?: Pt denies.  Leisure/Recreation:      Strengths/Needs:   What is the patient's perception of their strengths?: Pt denies. Patient states they can use these personal strengths during their treatment to contribute to their recovery: Pt denies. Patient states these barriers may affect/interfere with their treatment: Pt  denies. Patient states these barriers may affect their return to  the community: Pt denies.  Discharge Plan:   Currently receiving community mental health services: No Patient states concerns and preferences for aftercare planning are: Pt reports that he would like a referral to [facility]. Patient states they will know when they are safe and ready for discharge when: "I am ready to go right now.  It's not where I belong." Does patient have access to transportation?: No Does patient have financial barriers related to discharge medications?: Yes Patient description of barriers related to discharge medications: Chart indicates that patient does not have insurance. Plan for no access to transportation at discharge: CSW will assist with transportation needs. Will patient be returning to same living situation after discharge?: Yes  Summary/Recommendations:   Summary and Recommendations (to be completed by the evaluator): Patient is a 50 year old widowed male from [place], [place] [facility]).  He presents to the hospital following a overdose.  He identifies several triggers to his current mental health state. He reports that he lost a brother several months ago.  He reports that he has a conflictual relationship with his family members and family has indicated that "they don't want nothing to do with me".  He reports that he currently uses substances, and this has negative affected his relationships.  He reports that would like for a residential treatment and a referral to [facility].  Recommendations include: crisis stabilization, therapeutic milieu, encourage group attendance and participation, medication management for mood stabilization and development of comprehensive mental wellness/sobriety plan.  Harden Mo. 04/11/2021

## 2021-04-11 NOTE — Progress Notes (Signed)
Recreation Therapy Notes  INPATIENT RECREATION TR PLAN  Patient Details Name: ARLIE RIKER MRN: [id number] DOB: Apr 08, 1971 Today's Date: 04/11/2021  Rec Therapy Plan Is patient appropriate for Therapeutic Recreation?: Yes Treatment times per week: at least 3 Estimated Length of Stay: 5-7 days TR Treatment/Interventions: Group participation (Comment)  Discharge Criteria Pt will be discharged from therapy if:: Discharged Treatment plan/goals/alternatives discussed and agreed upon by:: Patient/family  Discharge Summary     Shauntea Lok 04/11/2021, 2:34 PM

## 2021-04-12 DIAGNOSIS — F332 Major depressive disorder, recurrent severe without psychotic features: Secondary | ICD-10-CM | POA: Diagnosis not present

## 2021-04-12 LAB — LIPID PANEL
Cholesterol: 189 mg/dL (ref 0–200)
HDL: 47 mg/dL (ref >40)
LDL Cholesterol: 118 mg/dL — ABNORMAL HIGH (ref 0–99)
Total CHOL/HDL Ratio: 4 RATIO
Triglycerides: 118 mg/dL (ref <150)
VLDL: 24 mg/dL (ref 0–40)

## 2021-04-12 LAB — HEMOGLOBIN A1C
Hgb A1c MFr Bld: 5.8 % — ABNORMAL HIGH (ref 4.8–5.6)
Mean Plasma Glucose: 119.76 mg/dL

## 2021-04-12 MED ORDER — LISINOPRIL 5 MG PO TABS
30.0000 mg | ORAL_TABLET | Freq: Every day | ORAL | Status: DC
  Administered 2021-04-13 – 2021-04-14 (×2): 30 mg via ORAL
  Filled 2021-04-12 (×2): qty 2

## 2021-04-12 NOTE — B[facility] Group Notes (Signed)
[facility] LCSW Group Therapy Note  Date/Time:  04/12/2021 1:00-2:00PM  Type of Therapy and Topic:  Group Therapy:  Healthy and Unhealthy Supports  Participation Level:  Did Not Attend   Description of Group:  Patients in this group were introduced to the idea of adding a variety of healthy supports to address the various needs in their lives.Patients discussed what additional healthy supports could be helpful in their recovery and wellness after discharge in order to prevent future hospitalizations.   An emphasis was placed on using counselor, doctor, therapy groups, 12-step groups, and problem-specific support groups to expand supports.  They also worked as a group on developing a specific plan for several patients to deal with unhealthy supports through boundary-setting, psychoeducation with loved ones, and even termination of relationships.   Therapeutic Goals:   1)  discuss importance of adding supports to stay well once out of the hospital  2)  compare healthy versus unhealthy supports and identify some examples of each  3)  generate ideas and descriptions of healthy supports that can be added  4)  offer mutual support about how to address unhealthy supports  5)  encourage active participation in and adherence to discharge plan    Summary of Patient Progress:  Patient did not attend group despite encouraged participation.    Therapeutic Modalities:   Motivational Interviewing Brief Solution-Focused Therapy  Norberto Sorenson, Theresia Majors 04/12/2021  3:06 PM

## 2021-04-12 NOTE — B[facility] Counselor (Addendum)
CSW spoke with patient 1:1 to discuss discharge planning.   Patient is preoccupied with discharge to obtain medical clearance through his orthopedic doctor at [facility], Dr. Elliot Gurney prior to his admission to [facility] for residential substance abuse treatment scheduled on 04/14/21. CSW offered to provide contact information to Orthopedic clinic to request documentation; however, patient declined. Patient states he has requested documentation via fax already and insisted that he must present to the clinic in person. CSW and patient discussed relapse prevention strategies.  Patient remains agreeable to [facility] admission. Intake scheduled for 04/14/21 at 1pm.   No additional concerns noted.   Albertine Patricia, MSW, [place], [place] 04/12/21 11:03AM

## 2021-04-12 NOTE — Plan of Care (Signed)
Patient denies SI/HI/AVH and contracts for safety.  Patient rates his Depression and Anxiety 4/10.  Patient did comply with medications as MD ordered.  Patient denies any adverse reactions from medications administered.  Patient has limited interaction on unit in therapeutic milieu. Emotion support offered. Education provided about importance of compliance with treatment plan. Patient discharged focused. Patient remains safe q 15 min safety checks continues. Will continue to monitor on unit during  Problem: Education: Goal: Emotional status will improve Outcome: Progressing   Problem: Education: Goal: Knowledge of [facility] Education information/materials will improve Outcome: Not Progressing   shift.

## 2021-04-12 NOTE — Progress Notes (Signed)
[facility] MD Progress Note  04/12/2021 12:09 PM MICK TANGUMA  MRN:  [id number] Subjective: Follow-up for this 50 year old man with a history of substance abuse and depression.  Patient seen and chart reviewed.  Patient states his mood is back to normal.  Denies feeling depressed.  Denies any suicidal thoughts at all.  Repeats to me the story that he had been sober from drugs for several months and then relapsed 1 time on heroin.  Patient has no specific physical complaints today.  Initially he appeared calm on the unit but during the interview the patient became fixated on the idea that perhaps he should be discharged today.  We reviewed his plan about going to [facility].  Patient says that he needs to go see an orthopedic surgeon to get a letter saying that he has no restrictions before he can go back to [facility]. Principal Problem: MDD (major depressive disorder), recurrent episode, severe (HCC) Diagnosis: Principal Problem:   MDD (major depressive disorder), recurrent episode, severe (HCC) Active Problems:   Hypertension   Opiate abuse, continuous (HCC)  Total Time spent with patient: 30 minutes  Past Psychiatric History: Past history of depression and substance abuse problems repeated presentation with drug abuse  Past Medical History:  Past Medical History:  Diagnosis Date   Back pain    Hypertension    Kidney stones    Leg fracture    Low testosterone    Opioid type dependence, abuse (HCC)    Opioid withdrawal (HCC)     Past Surgical History:  Procedure Laterality Date   FEMUR IM NAIL  11/07/2011   Procedure: INTRAMEDULLARY (IM) NAIL FEMORAL;  Surgeon: Venita Lick, MD;  Location: [facility] OR;  Service: Orthopedics;  Laterality: Left;   HERNIA REPAIR     Family History:  Family History  Problem Relation Age of Onset   Anxiety disorder Mother    Hypertension Mother    ADD / ADHD Son    Family Psychiatric  History: See previous Social History:  Social History   Substance and Sexual  Activity  Alcohol Use Yes   Comment: Socially     Social History   Substance and Sexual Activity  Drug Use Yes   Types: IV, Methamphetamines, Heroin   Comment: heroin    Social History   Socioeconomic History   Marital status: Widowed    Spouse name: Not on file   Number of children: Not on file   Years of education: Not on file   Highest education level: Not on file  Occupational History   Not on file  Tobacco Use   Smoking status: Some Days    Packs/day: 1.00    Years: 15.00    Pack years: 15.00    Types: Cigarettes   Smokeless tobacco: Never  Vaping Use   Vaping Use: Never used  Substance and Sexual Activity   Alcohol use: Yes    Comment: Socially   Drug use: Yes    Types: IV, Methamphetamines, Heroin    Comment: heroin   Sexual activity: Not Currently  Other Topics Concern   Not on file  Social History Narrative   Not on file   Social Determinants of Health   Financial Resource Strain: Not on file  Food Insecurity: Not on file  Transportation Needs: Not on file  Physical Activity: Not on file  Stress: Not on file  Social Connections: Not on file   Additional Social History:  Sleep: Fair  Appetite:  Fair  Current Medications: Current Facility-Administered Medications  Medication Dose Route Frequency Provider Last Rate Last Admin   acetaminophen (TYLENOL) tablet 650 mg  650 mg Oral Q6H PRN Gillermo Murdoch, NP       alum & mag hydroxide-simeth (MAALOX/MYLANTA) 200-200-20 MG/5ML suspension 30 mL  30 mL Oral Q4H PRN Gillermo Murdoch, NP       Melene Muller ON 04/13/2021] lisinopril (ZESTRIL) tablet 30 mg  30 mg Oral Daily Tiki Tucciarone T, MD       magnesium hydroxide (MILK OF MAGNESIA) suspension 30 mL  30 mL Oral Daily PRN Gillermo Murdoch, NP       OLANZapine (ZYPREXA) tablet 10 mg  10 mg Oral Q6H PRN Jesse Sans, MD   10 mg at 04/11/21 1227   QUEtiapine (SEROQUEL) tablet 100 mg  100 mg Oral QHS Gillermo Murdoch, NP       ziprasidone (GEODON) injection 20 mg  20 mg Intramuscular Q6H PRN Jesse Sans, MD        Lab Results:  Results for orders placed or performed during the hospital encounter of 04/10/21 (from the past 48 hour(s))  Lipid panel     Status: Abnormal   Collection Time: 04/12/21  6:47 AM  Result Value Ref Range   Cholesterol 189 0 - 200 mg/dL   Triglycerides 967 <591 mg/dL   HDL 47 >63 mg/dL   Total CHOL/HDL Ratio 4.0 RATIO   VLDL 24 0 - 40 mg/dL   LDL Cholesterol 846 (H) 0 - 99 mg/dL    Comment:        Total Cholesterol/HDL:CHD Risk Coronary Heart Disease Risk Table                     Men   Women  1/2 Average Risk   3.4   3.3  Average Risk       5.0   4.4  2 X Average Risk   9.6   7.1  3 X Average Risk  23.4   11.0        Use the calculated Patient Ratio above and the CHD Risk Table to determine the patient's CHD Risk.        ATP III CLASSIFICATION (LDL):  <100     mg/dL   Optimal  659-935  mg/dL   Near or Above                    Optimal  130-159  mg/dL   Borderline  701-779  mg/dL   High  >390     mg/dL   Very High Performed at [facility], [address] [place], [place] [address]   Hemoglobin A1c     Status: Abnormal   Collection Time: 04/12/21  6:47 AM  Result Value Ref Range   Hgb A1c MFr Bld 5.8 (H) 4.8 - 5.6 %    Comment: (NOTE) Pre diabetes:          5.7%-6.4%  Diabetes:              >6.4%  Glycemic control for   <7.0% adults with diabetes    Mean Plasma Glucose 119.76 mg/dL    Comment: Performed at [facility], [address] [place], [place] [address]    Blood Alcohol level:  Lab Results  Component Value Date   ETH <10 04/09/2021   ETH 165 (H) 11/07/2011    Metabolic Disorder Labs: Lab  Results  Component Value Date   HGBA1C 5.8 (H) 04/12/2021   MPG 119.76 04/12/2021   No results found for: PROLACTIN Lab Results  Component Value Date   CHOL 189 04/12/2021   TRIG 118 04/12/2021   HDL 47 04/12/2021    CHOLHDL 4.0 04/12/2021   VLDL 24 04/12/2021   LDLCALC 118 (H) 04/12/2021    Physical Findings: AIMS:  , ,  ,  ,    CIWA:    COWS:     Musculoskeletal: Strength & Muscle Tone: within normal limits Gait & Station: normal Patient leans: N/A  Psychiatric Specialty Exam:  Presentation  General Appearance: Casual  Eye Contact:Fair  Speech:Clear and Coherent; Normal Rate  Speech Volume:Normal  Handedness:Right   Mood and Affect  Mood:Anxious  Affect:Congruent   Thought Process  Thought Processes:Coherent  Descriptions of Associations:Intact  Orientation:Full (Time, Place and Person)  Thought Content:Logical  History of Schizophrenia/Schizoaffective disorder:No  Duration of Psychotic Symptoms:No data recorded Hallucinations:Hallucinations: None  Ideas of Reference:None  Suicidal Thoughts:Suicidal Thoughts: No  Homicidal Thoughts:Homicidal Thoughts: No   Sensorium  Memory:Immediate Fair; Recent Fair; Remote Fair  Judgment:Impaired  Insight:Lacking   Executive Functions  Concentration:Fair  Attention Span:Fair  Recall:Fair  Fund of Knowledge:Fair  Language:Fair   Psychomotor Activity  Psychomotor Activity:Psychomotor Activity: Normal   Assets  Assets:Communication Skills; Social Support   Sleep  Sleep:Sleep: Good Number of Hours of Sleep: 8    Physical Exam: Physical Exam Vitals and nursing note reviewed.  Constitutional:      Appearance: Normal appearance.  HENT:     Head: Normocephalic and atraumatic.     Mouth/Throat:     Pharynx: Oropharynx is clear.  Eyes:     Pupils: Pupils are equal, round, and reactive to light.  Cardiovascular:     Rate and Rhythm: Normal rate and regular rhythm.  Pulmonary:     Effort: Pulmonary effort is normal.     Breath sounds: Normal breath sounds.  Abdominal:     General: Abdomen is flat.     Palpations: Abdomen is soft.  Musculoskeletal:        General: Normal range of motion.   Skin:    General: Skin is warm and dry.  Neurological:     General: No focal deficit present.     Mental Status: He is alert. Mental status is at baseline.  Psychiatric:        Attention and Perception: Attention normal.        Mood and Affect: Mood is anxious.        Speech: Speech normal.        Behavior: Behavior is agitated. Behavior is not aggressive.        Thought Content: Thought content normal.        Cognition and Memory: Cognition normal.        Judgment: Judgment is impulsive.   Review of Systems  Constitutional: Negative.   HENT: Negative.    Eyes: Negative.   Respiratory: Negative.    Cardiovascular: Negative.   Gastrointestinal: Negative.   Musculoskeletal: Negative.   Skin: Negative.   Neurological: Negative.   Psychiatric/Behavioral: Negative.    Blood pressure (!) 135/103, pulse (!) 110, temperature 98.6 F (37 C), temperature source Oral, resp. rate 18, height 5\' 6"  (1.676 m), weight 62.6 kg, SpO2 97 %. Body mass index is 22.27 kg/m.   Treatment Plan Summary: Plan in my opinion and that of the rest of the treatment team patient is at very high risk  of relapse into drug use if he leaves the hospital over the weekend.  Recommend waiting until at least Monday before planning to go to [place].  The idea that he is going to get in to see an orthopedic surgeon who is going to write him a note over the weekend is obviously impractical and impossible.  This sounds mostly like an excuse.  I did review his blood pressure and he is still running high a blood pressure despite the lisinopril.  Increase dose to 30 mg.  Encourage group attendance and self reflection.  Mordecai RasmussenJohn Chiana Wamser, MD 04/12/2021, 12:09 PM

## 2021-04-13 DIAGNOSIS — F332 Major depressive disorder, recurrent severe without psychotic features: Secondary | ICD-10-CM | POA: Diagnosis not present

## 2021-04-13 MED ORDER — HYDROXYZINE HCL 25 MG PO TABS: 25.0000 mg | ORAL_TABLET | Freq: Four times a day (QID) | ORAL | Status: DC | PRN

## 2021-04-13 MED ORDER — ONDANSETRON 4 MG PO TBDP: 4.0000 mg | ORAL_TABLET | Freq: Four times a day (QID) | ORAL | Status: DC | PRN

## 2021-04-13 MED ORDER — CLONIDINE HCL 0.1 MG PO TABS
0.1000 mg | ORAL_TABLET | ORAL | Status: DC
Start: 2021-04-15 — End: 2021-04-14

## 2021-04-13 MED ORDER — DICYCLOMINE HCL 20 MG PO TABS
20.0000 mg | ORAL_TABLET | Freq: Four times a day (QID) | ORAL | Status: DC | PRN
  Filled 2021-04-13: qty 1

## 2021-04-13 MED ORDER — ENSURE ENLIVE PO LIQD
237.0000 mL | Freq: Two times a day (BID) | ORAL | Status: DC
  Administered 2021-04-13 – 2021-04-14 (×3): 237 mL via ORAL

## 2021-04-13 MED ORDER — GABAPENTIN 300 MG PO CAPS
300.0000 mg | ORAL_CAPSULE | Freq: Three times a day (TID) | ORAL | Status: DC
  Administered 2021-04-13 – 2021-04-14 (×4): 300 mg via ORAL
  Filled 2021-04-13 (×4): qty 1

## 2021-04-13 MED ORDER — METHOCARBAMOL 500 MG PO TABS: 500.0000 mg | ORAL_TABLET | Freq: Three times a day (TID) | ORAL | Status: DC | PRN

## 2021-04-13 MED ORDER — CLONIDINE HCL 0.1 MG PO TABS
0.1000 mg | ORAL_TABLET | Freq: Four times a day (QID) | ORAL | Status: DC
  Administered 2021-04-13 – 2021-04-14 (×5): 0.1 mg via ORAL
  Filled 2021-04-13 (×5): qty 1

## 2021-04-13 MED ORDER — HALOPERIDOL 5 MG PO TABS: 5.0000 mg | ORAL_TABLET | Freq: Every day | ORAL | Status: DC | PRN

## 2021-04-13 MED ORDER — LOPERAMIDE HCL 2 MG PO CAPS: 2.0000 mg | ORAL_CAPSULE | ORAL | Status: DC | PRN

## 2021-04-13 MED ORDER — NAPROXEN 500 MG PO TABS
500.0000 mg | ORAL_TABLET | Freq: Two times a day (BID) | ORAL | Status: DC | PRN
  Filled 2021-04-13: qty 1

## 2021-04-13 MED ORDER — CLONIDINE HCL 0.1 MG PO TABS: 0.1000 mg | ORAL_TABLET | Freq: Every day | ORAL | Status: DC

## 2021-04-13 MED ORDER — HALOPERIDOL LACTATE 5 MG/ML IJ SOLN: 5.0000 mg | Freq: Every day | INTRAMUSCULAR | Status: DC | PRN

## 2021-04-13 NOTE — [facility] Group Notes (Signed)
LCSW Group Therapy Note  04/13/2021 1:00PM  Type of Therapy and Topic: Group Therapy: Cognitive Distortions and Behavioral Activation  Participation Level: Did Not Attend  Description of Group:  Patients in this group will be introduced to the cognitive triangle and learn about the relationship between thoughts, behaviors, and emotions and how thoughts effect they way a person feels and behaves. Patients will investigate common cognitive distortions. Patients will identify and describe cognitive distortions, and describe the feelings these distortions create for them. Patients will identify one or more situations in their personal life where they have cognitively distorted thinking and will verbalize challenging this cognitive distortion through positive thinking skills. Patients will additionally investigate how behaviors effect a way a person feels and thinks. Patients will identify healthy behaviors they can practice that promote wellness and recovery.   Therapeutic Goals:  1. Patient will identify two or more cognitive distortions they have used  2. Patient will identify one or more emotions that stem from use of a cognitive distortion  3. Patient will demonstrate use of a positive affirmation to counter a cognitive distortion through discussion and/or role play  4. Patient will describe one way cognitive distortions can be detrimental to wellness  5. Patient will identify one or more behaviors that promotes wellness   Summary of Patient Progress: Patient did not attend group despite encouraged participation.   Therapeutic Modalities:  Cognitive Behavioral Therapy  Motivational Interviewing  Norberto Sorenson, Theresia Majors  04/13/2021 3:10 PM

## 2021-04-13 NOTE — [facility] Group Notes (Signed)
[facility] Group Notes:  (Nursing/MHT/Case Management/Adjunct)  Date:  04/13/2021  Time:  11:33 PM  Type of Therapy:  Group Therapy  Participation Level:  Did Not Attend   Mayra Neer 04/13/2021, 11:33 PM

## 2021-04-13 NOTE — Progress Notes (Signed)
D: Pt alert and oriented. Pt denies experiencing any anxiety/depression at this time. Pt denies experiencing any pain at this time. Pt denies experiencing any SI/HI, or AVH at this time.   Pt was irritable this afternoon wanting to discharge however with therapeutic conversation did de-escalate.   A: Scheduled medications administered to pt, per MD orders. Support and encouragement provided. Frequent verbal contact made. Routine safety checks conducted q15 minutes.   R: No adverse drug reactions noted. Pt verbally contracts for safety at this time. Pt complaint with medications. Pt self isolates to room with exception of dinner. Pt remains safe at this time. Will continue to monitor.

## 2021-04-13 NOTE — Progress Notes (Signed)
Patient approached CSW and stated that he is being held against his will. Patient stated he spoke to his family and they stated they he cannot he held here. Patient stated that he is leaving at 12:00 PM whether the doctor lets him go. Patient stated he will push the door open to get out. Patient stated he is tired and being here and he is not crazy. Patient also stated he does not remember agreeing to go to rehab. Patient stated when your on drugs you will choose when you want to get clean.

## 2021-04-13 NOTE — Plan of Care (Signed)
  Problem: Education: Goal: Knowledge of [facility] Education information/materials will improve Outcome: Progressing Goal: Emotional status will improve Outcome: Progressing Goal: Mental status will improve Outcome: Progressing Goal: Verbalization of understanding the information provided will improve Outcome: Progressing   Problem: Safety: Goal: Periods of time without injury will increase Outcome: Progressing   Problem: Education: Goal: Utilization of techniques to improve thought processes will improve Outcome: Progressing Goal: Knowledge of the prescribed therapeutic regimen will improve Outcome: Progressing   Problem: Safety: Goal: Ability to disclose and discuss suicidal ideas will improve Outcome: Progressing Goal: Ability to identify and utilize support systems that promote safety will improve Outcome: Progressing   

## 2021-04-13 NOTE — Progress Notes (Signed)
Pt is A&Ox4, calm, cooperative, isolates in  his room, is medication complaint, denies SI/HI/AVH, denies feelings of depression and anxiety, is quiet, napping in his room most of the shift thus far, makes poor eye contact, denies COWS withdrawal symptoms at this time. Will continue to monitor pt per Q15 minute face checks and monitor for safety and progress.

## 2021-04-13 NOTE — Progress Notes (Signed)
[facility] MD Progress Note  04/13/2021 2:06 PM Carlos Rubio  MRN:  [id number]  Subjective: Follow-up for this 50 year old man with a history of substance abuse and depression.  Client irritable and wanting to leave earlier this morning.  "I'm not staying another day.  I don't care.  I will problem relapse in rehab."  He is day 2 in his detox from opiates and stimulants, elevated BP and pulse.  Clonidine opiate withdrawal protocol started along with gabapentin 300 mg TID.  He is scheduled to go to [facility] tomorrow and Dr Toni Amend spoke with him yesterday about the importance of staying until he can get to rehab to prevent relapse.  Later, he was quietly in bed with no distress.    Principal Problem: MDD (major depressive disorder), recurrent episode, severe (HCC) Diagnosis: Principal Problem:   MDD (major depressive disorder), recurrent episode, severe (HCC) Active Problems:   Opiate abuse, continuous (HCC)   Hypertension  Total Time spent with patient: 30 minutes  Past Psychiatric History: Past history of depression and substance abuse problems repeated presentation with drug abuse  Past Medical History:  Past Medical History:  Diagnosis Date   Back pain    Hypertension    Kidney stones    Leg fracture    Low testosterone    Opioid type dependence, abuse (HCC)    Opioid withdrawal (HCC)     Past Surgical History:  Procedure Laterality Date   FEMUR IM NAIL  11/07/2011   Procedure: INTRAMEDULLARY (IM) NAIL FEMORAL;  Surgeon: Venita Lick, MD;  Location: [facility] OR;  Service: Orthopedics;  Laterality: Left;   HERNIA REPAIR     Family History:  Family History  Problem Relation Age of Onset   Anxiety disorder Mother    Hypertension Mother    ADD / ADHD Son    Family Psychiatric  History: See previous Social History:  Social History   Substance and Sexual Activity  Alcohol Use Yes   Comment: Socially     Social History   Substance and Sexual Activity  Drug Use Yes   Types: IV,  Methamphetamines, Heroin   Comment: heroin    Social History   Socioeconomic History   Marital status: Widowed    Spouse name: Not on file   Number of children: Not on file   Years of education: Not on file   Highest education level: Not on file  Occupational History   Not on file  Tobacco Use   Smoking status: Some Days    Packs/day: 1.00    Years: 15.00    Pack years: 15.00    Types: Cigarettes   Smokeless tobacco: Never  Vaping Use   Vaping Use: Never used  Substance and Sexual Activity   Alcohol use: Yes    Comment: Socially   Drug use: Yes    Types: IV, Methamphetamines, Heroin    Comment: heroin   Sexual activity: Not Currently  Other Topics Concern   Not on file  Social History Narrative   Not on file   Social Determinants of Health   Financial Resource Strain: Not on file  Food Insecurity: Not on file  Transportation Needs: Not on file  Physical Activity: Not on file  Stress: Not on file  Social Connections: Not on file   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    Sleep: Fair  Appetite:  Fair  Current Medications: Current Facility-Administered Medications  Medication Dose Route Frequency Provider Last Rate  Last Admin   acetaminophen (TYLENOL) tablet 650 mg  650 mg Oral Q6H PRN Gillermo Murdochhompson, Jacqueline, NP       alum & mag hydroxide-simeth (MAALOX/MYLANTA) 200-200-20 MG/5ML suspension 30 mL  30 mL Oral Q4H PRN Gillermo Murdochhompson, Jacqueline, NP       cloNIDine (CATAPRES) tablet 0.1 mg  0.1 mg Oral QID Charm RingsLord, Exavior Kimmons Y, NP   0.1 mg at 04/13/21 1109   Followed by   Melene Muller[START ON 04/15/2021] cloNIDine (CATAPRES) tablet 0.1 mg  0.1 mg Oral Lenda KelpBH-qamhs Dorr Perrot Y, NP       Followed by   Melene Muller[START ON 04/18/2021] cloNIDine (CATAPRES) tablet 0.1 mg  0.1 mg Oral QAC breakfast Charm RingsLord, Ubaldo Daywalt Y, NP       dicyclomine (BENTYL) tablet 20 mg  20 mg Oral Q6H PRN Charm RingsLord, Guss Farruggia Y, NP       feeding supplement (ENSURE ENLIVE / ENSURE PLUS) liquid 237 mL  237 mL  Oral BID BM Charm RingsLord, Erisha Paugh Y, NP   237 mL at 04/13/21 1107   gabapentin (NEURONTIN) capsule 300 mg  300 mg Oral TID Charm RingsLord, Johann Santone Y, NP   300 mg at 04/13/21 1107   haloperidol (HALDOL) tablet 5 mg  5 mg Oral Daily PRN Charm RingsLord, Ayden Apodaca Y, NP       Or   haloperidol lactate (HALDOL) injection 5 mg  5 mg Intramuscular Daily PRN Charm RingsLord, Anai Lipson Y, NP       hydrOXYzine (ATARAX/VISTARIL) tablet 25 mg  25 mg Oral Q6H PRN Charm RingsLord, Graig Hessling Y, NP       lisinopril (ZESTRIL) tablet 30 mg  30 mg Oral Daily Clapacs, Jackquline DenmarkJohn T, MD   30 mg at 04/13/21 16100817   loperamide (IMODIUM) capsule 2-4 mg  2-4 mg Oral PRN Charm RingsLord, Ashari Llewellyn Y, NP       magnesium hydroxide (MILK OF MAGNESIA) suspension 30 mL  30 mL Oral Daily PRN Gillermo Murdochhompson, Jacqueline, NP       methocarbamol (ROBAXIN) tablet 500 mg  500 mg Oral Q8H PRN Charm RingsLord, Veera Stapleton Y, NP       naproxen (NAPROSYN) tablet 500 mg  500 mg Oral BID PRN Charm RingsLord, Jamonica Schoff Y, NP       ondansetron (ZOFRAN-ODT) disintegrating tablet 4 mg  4 mg Oral Q6H PRN Charm RingsLord, Nikiah Goin Y, NP       QUEtiapine (SEROQUEL) tablet 100 mg  100 mg Oral QHS Gillermo Murdochhompson, Jacqueline, NP   100 mg at 04/12/21 2221    Lab Results:  Results for orders placed or performed during the hospital encounter of 04/10/21 (from the past 48 hour(s))  Lipid panel     Status: Abnormal   Collection Time: 04/12/21  6:47 AM  Result Value Ref Range   Cholesterol 189 0 - 200 mg/dL   Triglycerides 960118 <454<150 mg/dL   HDL 47 >09>40 mg/dL   Total CHOL/HDL Ratio 4.0 RATIO   VLDL 24 0 - 40 mg/dL   LDL Cholesterol 811118 (H) 0 - 99 mg/dL    Comment:        Total Cholesterol/HDL:CHD Risk Coronary Heart Disease Risk Table                     Men   Women  1/2 Average Risk   3.4   3.3  Average Risk       5.0   4.4  2 X Average Risk   9.6   7.1  3 X Average Risk  23.4   11.0  Use the calculated Patient Ratio above and the CHD Risk Table to determine the patient's CHD Risk.        ATP III CLASSIFICATION (LDL):  <100     mg/dL   Optimal  979-480   mg/dL   Near or Above                    Optimal  130-159  mg/dL   Borderline  165-537  mg/dL   High  >482     mg/dL   Very High Performed at [facility], [address] [place], [place] [address]   Hemoglobin A1c     Status: Abnormal   Collection Time: 04/12/21  6:47 AM  Result Value Ref Range   Hgb A1c MFr Bld 5.8 (H) 4.8 - 5.6 %    Comment: (NOTE) Pre diabetes:          5.7%-6.4%  Diabetes:              >6.4%  Glycemic control for   <7.0% adults with diabetes    Mean Plasma Glucose 119.76 mg/dL    Comment: Performed at [facility], [address] [place], [place] [address]    Blood Alcohol level:  Lab Results  Component Value Date   ETH <10 04/09/2021   ETH 165 (H) 11/07/2011    Metabolic Disorder Labs: Lab Results  Component Value Date   HGBA1C 5.8 (H) 04/12/2021   MPG 119.76 04/12/2021   No results found for: PROLACTIN Lab Results  Component Value Date   CHOL 189 04/12/2021   TRIG 118 04/12/2021   HDL 47 04/12/2021   CHOLHDL 4.0 04/12/2021   VLDL 24 04/12/2021   LDLCALC 118 (H) 04/12/2021     Musculoskeletal: Strength & Muscle Tone: within normal limits Gait & Station: normal Patient leans: N/A  Psychiatric Specialty Exam:  Presentation  General Appearance: Casual  Eye Contact:Fair  Speech:Clear and Coherent; Normal Rate  Speech Volume:Normal  Handedness:Right   Mood and Affect  Mood:  Anxious and irritable Affect:Congruent   Thought Process  Thought Processes:Coherent  Descriptions of Associations:Intact  Orientation:Full (Time, Place and Person)  Thought Content:Logical  History of Schizophrenia/Schizoaffective disorder:No  Duration of Psychotic Symptoms:  None Hallucinations:  None  Ideas of Reference:None  Suicidal Thoughts:  None  Homicidal Thoughts:  None  Sensorium  Memory:Immediate Fair; Recent Fair; Remote Fair  Judgment:  Fair to poor Insight:Lacking   Executive Functions   Concentration:Fair  Attention Span:Fair  Recall:Fair  [employer] of Knowledge:Fair  Language:Fair  Psychomotor Activity  Psychomotor Activity:  WDL  Assets  Assets:Communication Skills; Social Support  Sleep  Sleep:  Good  Physical Exam: Physical Exam Vitals and nursing note reviewed.  Constitutional:      Appearance: Normal appearance.  HENT:     Head: Normocephalic and atraumatic.     Nose: Nose normal.     Mouth/Throat:     Pharynx: Oropharynx is clear.  Pulmonary:     Effort: Pulmonary effort is normal.  Musculoskeletal:        General: Normal range of motion.  Skin:    General: Skin is warm.  Neurological:     General: No focal deficit present.     Mental Status: He is alert. Mental status is at baseline.  Psychiatric:        Attention and Perception: Attention normal.        Mood and Affect: Mood is anxious.  Speech: Speech normal.        Behavior: Behavior is not aggressive. Behavior is cooperative.        Thought Content: Thought content normal.        Cognition and Memory: Cognition normal.        Judgment: Judgment is impulsive.   Review of Systems  Constitutional: Negative.   HENT: Negative.    Eyes: Negative.   Respiratory: Negative.    Cardiovascular: Negative.   Gastrointestinal: Negative.   Musculoskeletal: Negative.   Skin: Negative.   Neurological: Negative.   Psychiatric/Behavioral:  Positive for substance abuse. The patient is nervous/anxious.   Blood pressure (!) 146/105, pulse 97, temperature 98.4 F (36.9 C), temperature source Oral, resp. rate (!) 22, height 5\' 6"  (1.676 m), weight 62.6 kg, SpO2 100 %. Body mass index is 22.27 kg/m.   Treatment Plan Summary: Opiate use disorder: -Started clonidine opiate withdrawal protocol -Started gabapentin 300 mg TID -Transfer to rehab tomorrow  Mood stabilization and sleep: -continue Seroquel 100 mg daily at bedtime  , NP 04/13/2021, 2:06 PM

## 2021-04-14 ENCOUNTER — Other Ambulatory Visit: Payer: Self-pay

## 2021-04-14 DIAGNOSIS — F332 Major depressive disorder, recurrent severe without psychotic features: Secondary | ICD-10-CM | POA: Diagnosis not present

## 2021-04-14 MED ORDER — QUETIAPINE FUMARATE 100 MG PO TABS
100.0000 mg | ORAL_TABLET | Freq: Every day | ORAL | 0 refills | Status: AC
  Filled 2021-04-14: qty 7, 7d supply, fill #0

## 2021-04-14 MED ORDER — LISINOPRIL 30 MG PO TABS
30.0000 mg | ORAL_TABLET | Freq: Every day | ORAL | 0 refills | Status: AC
  Filled 2021-04-14: qty 7, 7d supply, fill #0

## 2021-04-14 MED ORDER — QUETIAPINE FUMARATE 100 MG PO TABS: 100.0000 mg | ORAL_TABLET | Freq: Every day | ORAL | 1 refills | Status: DC

## 2021-04-14 MED ORDER — LISINOPRIL 30 MG PO TABS: 30.0000 mg | ORAL_TABLET | Freq: Every day | ORAL | 1 refills | Status: DC

## 2021-04-14 NOTE — Discharge Summary (Signed)
Physician Discharge Summary Note  Patient:  Carlos Rubio is an 50 y.o., male MRN:  [id number] DOB:  1970-09-29 Patient phone:  [phone] (home)  Patient address:   [address] [place] [address],  Total Time spent with patient: 1 hour  Date of Admission:  04/10/2021 Date of Discharge: 04/14/2021  Reason for Admission:  Worsening depression and suicidal ideations in the context of relapse on heroin  Principal Problem: MDD (major depressive disorder), recurrent episode, severe (HCC) Discharge Diagnoses: Principal Problem:   MDD (major depressive disorder), recurrent episode, severe (HCC) Active Problems:   Hypertension   Opiate abuse, continuous (HCC)   Past Psychiatric History: History of opioid use disorder. Bipolar disorder also listed in the chart, though patient cannot describe any hypomanic or manic episodes. He denies ever seeing any mental health providers in the past.   Past Medical History:  Past Medical History:  Diagnosis Date   Back pain    Hypertension    Kidney stones    Leg fracture    Low testosterone    Opioid type dependence, abuse (HCC)    Opioid withdrawal (HCC)     Past Surgical History:  Procedure Laterality Date   FEMUR IM NAIL  11/07/2011   Procedure: INTRAMEDULLARY (IM) NAIL FEMORAL;  Surgeon: Venita Lick, MD;  Location: [facility] OR;  Service: Orthopedics;  Laterality: Left;   HERNIA REPAIR     Family History:  Family History  Problem Relation Age of Onset   Anxiety disorder Mother    Hypertension Mother    ADD / ADHD Son    Family Psychiatric  History: Mother with anxiety, son with ADHD. Family history of substance abuse  Social History:  Social History   Substance and Sexual Activity  Alcohol Use Yes   Comment: Socially     Social History   Substance and Sexual Activity  Drug Use Yes   Types: IV, Methamphetamines, Heroin   Comment: heroin    Social History   Socioeconomic History   Marital status: Widowed    Spouse name:  Not on file   Number of children: Not on file   Years of education: Not on file   Highest education level: Not on file  Occupational History   Not on file  Tobacco Use   Smoking status: Some Days    Packs/day: 1.00    Years: 15.00    Pack years: 15.00    Types: Cigarettes   Smokeless tobacco: Never  Vaping Use   Vaping Use: Never used  Substance and Sexual Activity   Alcohol use: Yes    Comment: Socially   Drug use: Yes    Types: IV, Methamphetamines, Heroin    Comment: heroin   Sexual activity: Not Currently  Other Topics Concern   Not on file  Social History Narrative   Not on file   Social Determinants of Health   Financial Resource Strain: Not on file  Food Insecurity: Not on file  Transportation Needs: Not on file  Physical Activity: Not on file  Stress: Not on file  Social Connections: Not on file    Hospital Course:  Patient admitted for worsening depression and suicidal ideations in the context of relapse on heroin. He was restarted on Lisinopril and titrated to 30 mg daily for HTN. He was also placed back on Seroquel for mood and insomnia. He initially expressed a desire to go to [facility], and team set up a phone interview him for today. He ultimately declined interview  and stated he needed to return home to his family first and get a letter from his orthopedist. He was advised to stay in the hospital and transfer directly to [facility], but declined. He denies suicidal ideations, homicidal ideations, visual hallucinations, and auditory hallucinations. He did not meet grounds for involuntary committment, and was discharged home per request.   Physical Findings: AIMS: Facial and Oral Movements Muscles of Facial Expression: None, normal Lips and Perioral Area: None, normal Jaw: None, normal Tongue: None, normal,Extremity Movements Upper (arms, wrists, hands, fingers): None, normal Lower (legs, knees, ankles, toes): None, normal, Trunk Movements Neck, shoulders, hips:  None, normal, Overall Severity Severity of abnormal movements (highest score from questions above): None, normal Incapacitation due to abnormal movements: None, normal Patient's awareness of abnormal movements (rate only patient's report): No Awareness, Dental Status Current problems with teeth and/or dentures?: No Does patient usually wear dentures?: No  CIWA:    COWS:  COWS Total Score: 2  Musculoskeletal: Strength & Muscle Tone: within normal limits Gait & Station: normal Patient leans: N/A   Psychiatric Specialty Exam:  Presentation  General Appearance: Appropriate for Environment; Casual  Eye Contact:Good  Speech:Clear and Coherent; Normal Rate  Speech Volume:Normal  Handedness:Right   Mood and Affect  Mood:Euthymic  Affect:Congruent   Thought Process  Thought Processes:Coherent; Goal Directed  Descriptions of Associations:Intact  Orientation:Full (Time, Place and Person)  Thought Content:Logical  History of Schizophrenia/Schizoaffective disorder:No  Duration of Psychotic Symptoms:No data recorded Hallucinations:Hallucinations: None  Ideas of Reference:None  Suicidal Thoughts:Suicidal Thoughts: No  Homicidal Thoughts:Homicidal Thoughts: No   Sensorium  Memory:Immediate Fair; Recent Fair; Remote Fair  Judgment:Intact  Insight:Present   Executive Functions  Concentration:Fair  Attention Span:Fair  Recall:Fair  Fund of Knowledge:Fair  Language:Fair   Psychomotor Activity  Psychomotor Activity:Psychomotor Activity: Normal   Assets  Assets:Communication Skills; Desire for Improvement; Physical Health; Resilience; Social Support   Sleep  Sleep:Sleep: Fair    Physical Exam: Physical Exam ROS Blood pressure 137/89, pulse (!) 114, temperature 98.1 F (36.7 C), temperature source Oral, resp. rate 18, height 5\' 6"  (1.676 m), weight 62.6 kg, SpO2 99 %. Body mass index is 22.27 kg/m.   Social History   Tobacco Use  Smoking  Status Some Days   Packs/day: 1.00   Years: 15.00   Pack years: 15.00   Types: Cigarettes  Smokeless Tobacco Never   Tobacco Cessation:  A prescription for an FDA-approved tobacco cessation medication was offered at discharge and the patient refused   Blood Alcohol level:  Lab Results  Component Value Date   ETH <10 04/09/2021   ETH 165 (H) 11/07/2011    Metabolic Disorder Labs:  Lab Results  Component Value Date   HGBA1C 5.8 (H) 04/12/2021   MPG 119.76 04/12/2021   No results found for: PROLACTIN Lab Results  Component Value Date   CHOL 189 04/12/2021   TRIG 118 04/12/2021   HDL 47 04/12/2021   CHOLHDL 4.0 04/12/2021   VLDL 24 04/12/2021   LDLCALC 118 (H) 04/12/2021    See Psychiatric Specialty Exam and Suicide Risk Assessment completed by Attending Physician prior to discharge.  Discharge destination:  Home  Is patient on multiple antipsychotic therapies at discharge:  No   Has Patient had three or more failed trials of antipsychotic monotherapy by history:  No  Recommended Plan for Multiple Antipsychotic Therapies: NA  Discharge Instructions     Diet - low sodium heart healthy   Complete by: As directed    Increase activity  slowly   Complete by: As directed       Allergies as of 04/14/2021       Reactions   Benadryl [diphenhydramine]    Hyperactivity   Diphenhydramine Hcl Other (See Comments)   Elevated BP   Iodinated Diagnostic Agents Other (See Comments)   "paralyzed, couldn't move or breathe"        Medication List     TAKE these medications      Indication  lisinopril 30 MG tablet Commonly known as: ZESTRIL Take 1 tablet (30 mg total) by mouth daily. Start taking on: April 15, 2021 What changed:  medication strength how much to take  Indication: High Blood Pressure Disorder   QUEtiapine 100 MG tablet Commonly known as: SEROquel Take 1 tablet (100 mg total) by mouth at bedtime. What changed:  how much to take how to take  this when to take this additional instructions  Indication: Major Depressive Disorder        Follow-up Information     [facility] Follow up.   Why: Interview with Burnett Harry is scheduled for 04/14/2021 at 1:00PM.  Thanks! Contact information: [address] [place] [address] [phone]         [facility] Follow up.   Why: Here is another outpatient resource if needed. They have walk-in hours Monday, Wednesday, and Friday from 8 AM to 4 PM. Contact information: [address] [facility] [address] [phone]                 Follow-up recommendations:  Activity:  as tolerated Diet:  low sodium heart healthy diet  Comments:  7-day supply of free medications provided to patient along with printed 30-day scripts with 1 refill   Signed: Jesse Sans, MD 04/14/2021, 1:20 PM

## 2021-04-14 NOTE — BHH Counselor (Signed)
CSW spoke with patients nurse.  CSW offered for patient to call and complete his [facility] interview while he waited for transportation.    Per Nurse Maryelizabeth Kaufmann, pt declined to do the interview.  Penni Homans, MSW, LCSW 04/14/2021 1:16 PM

## 2021-04-14 NOTE — BHH Counselor (Addendum)
CSW met with pt briefly to discussion discharge. He stated that he needs transportation assistance because by time he gets ready to discharge his transportation will be at work. CSW agreed to assist. Cabin crew signed. Pt gave CSW address for disposition. CSW inquired if pt needed anything else. He stated that he has clothes but no shoes, stating that he wears an eight and a half. CSW stated that we would get him some shoes. Pt and CSW discussed aftercare briefly. Pt stated that he plans to follow up with [facility] but needs to go get clearance for his foot from his primary care provider before he can go there. CSW stated that [facility] would be added to his resource list as well if any issues arise.  No other concerns expressed. Contact ended without incident.   Chalmers Guest. Guerry Bruin, MSW, [place], [place] 04/14/2021 10:05 AM

## 2021-04-14 NOTE — Plan of Care (Signed)
  Problem: Group Participation Goal: STG - Patient will engage in groups without prompting or encouragement from LRT x3 group sessions within 5 recreation therapy group sessions Description: STG - Patient will engage in groups without prompting or encouragement from LRT x3 group sessions within 5 recreation therapy group sessions 04/14/2021 1311 by Ernest Haber, LRT Outcome: Not Applicable 1/66/0630 1601 by Ernest Haber, LRT Outcome: Not Met (add Reason) Note: Patient did not attend any groups.

## 2021-04-14 NOTE — Progress Notes (Signed)
Recreation Therapy Notes  INPATIENT RECREATION TR PLAN  Patient Details Name: Carlos Rubio MRN: [id number] DOB: [birth year] Today's Date: 04/14/2021  Rec Therapy Plan Is patient appropriate for Therapeutic Recreation?: Yes Treatment times per week: at least 3 Estimated Length of Stay: 5-7 days TR Treatment/Interventions: Group participation (Comment)  Discharge Criteria Pt will be discharged from therapy if:: Discharged Treatment plan/goals/alternatives discussed and agreed upon by:: Patient/family  Discharge Summary Short term goals set: Patient will engage in groups without prompting or encouragement from LRT x3 group sessions within 5 recreation therapy group sessions Short term goals met: Not met Reason goals not met: Patient did not attend any groups. Therapeutic equipment acquired: N/A Reason patient discharged from therapy: Discharge from hospital Pt/family agrees with progress & goals achieved: Yes Date patient discharged from therapy: 04/14/21   Blessyn [place] 04/14/2021, 1:13 PM

## 2021-04-14 NOTE — BHH Suicide Risk Assessment (Signed)
[facility] Discharge Suicide Risk Assessment   Principal Problem: MDD (major depressive disorder), recurrent episode, severe (HCC) Discharge Diagnoses: Principal Problem:   MDD (major depressive disorder), recurrent episode, severe (HCC) Active Problems:   Hypertension   Opiate abuse, continuous (HCC)   Total Time spent with patient: 35 minutes- 25 minutes face-to-face contact with patient, 10 minutes documentation, coordination of care, scripts   Musculoskeletal: Strength & Muscle Tone: within normal limits Gait & Station: normal Patient leans: N/A  Psychiatric Specialty Exam  Presentation  General Appearance: Appropriate for Environment; Casual  Eye Contact:Good  Speech:Clear and Coherent; Normal Rate  Speech Volume:Normal  Handedness:Right   Mood and Affect  Mood:Euthymic  Duration of Depression Symptoms: Greater than two weeks  Affect:Congruent   Thought Process  Thought Processes:Coherent; Goal Directed  Descriptions of Associations:Intact  Orientation:Full (Time, Place and Person)  Thought Content:Logical  History of Schizophrenia/Schizoaffective disorder:No  Duration of Psychotic Symptoms:No data recorded Hallucinations:Hallucinations: None  Ideas of Reference:None  Suicidal Thoughts:Suicidal Thoughts: No  Homicidal Thoughts:Homicidal Thoughts: No   Sensorium  Memory:Immediate Fair; Recent Fair; Remote Fair  Judgment:Intact  Insight:Present   Executive Functions  Concentration:Fair  Attention Span:Fair  Recall:Fair  Fund of Knowledge:Fair  Language:Fair   Psychomotor Activity  Psychomotor Activity:Psychomotor Activity: Normal   Assets  Assets:Communication Skills; Desire for Improvement; Physical Health; Resilience; Social Support   Sleep  Sleep:Sleep: Fair   Physical Exam: Physical Exam ROS Blood pressure (!) 140/93, pulse (!) 114, temperature 98.1 F (36.7 C), temperature source Oral, resp. rate 18, height 5\' 6"   (1.676 m), weight 62.6 kg, SpO2 99 %. Body mass index is 22.27 kg/m.  Mental Status Per Nursing Assessment::   On Admission:  NA  Demographic Factors:  Male and Caucasian  Loss Factors: NA  Historical Factors: Impulsivity  Risk Reduction Factors:   Sense of responsibility to family, Positive social support, Positive therapeutic relationship, and Positive coping skills or problem solving skills  Continued Clinical Symptoms:  Depression:   Recent sense of peace/wellbeing Alcohol/Substance Abuse/Dependencies Previous Psychiatric Diagnoses and Treatments  Cognitive Features That Contribute To Risk:  None    Suicide Risk:  Minimal: No identifiable suicidal ideation.  Patients presenting with no risk factors but with morbid ruminations; may be classified as minimal risk based on the severity of the depressive symptoms   Follow-up Information     [facility] Follow up.   Why: Interview with [phone] is scheduled for 04/14/2021 at 1:00PM.  Thanks! Contact information: [address] [place] [phone]                 Plan Of Care/Follow-up recommendations:  Activity:  as tolerated Diet:  low sodium heart healthy diet  [phone], MD 04/14/2021, 9:55 AM

## 2021-04-14 NOTE — Progress Notes (Signed)
Recreation Therapy Notes   Date: 04/14/2021  Time: 9:30 am   Location: Courtyard   Behavioral response: N/A   Intervention Topic: Social skills    Discussion/Intervention: Patient did not attend group.   Clinical Observations/Feedback:  Patient did not attend group.   Jamas Jaquay LRT/CTRS          Chardonnay Holzmann 04/14/2021 12:37 PM

## 2021-04-14 NOTE — Progress Notes (Signed)
  [facility] Adult Case Management Discharge Plan :  Will you be returning to the same living situation after discharge:  No. At discharge, do you have transportation home?: Yes,  CSW will arrange transportation. Do you have the ability to pay for your medications: No.  Release of information consent forms completed and in the chart;  Patient's signature needed at discharge.  Patient to Follow up at:  Follow-up Information     [facility] Follow up.   Why: Interview with Burnett Harry is scheduled for 04/14/2021 at 1:00PM.  Thanks! Contact information: [address] [place] [address] [phone]         [facility] Follow up.   Why: Here is another outpatient resource if needed. They have walk-in hours Monday, Wednesday, and Friday from 8 AM to 4 PM. Contact information: [address] [facility] [address] [phone]                 Next level of care provider has access to [facility] Link:no  Safety Planning and Suicide Prevention discussed: Yes,  SPE completed with pt.     Has patient been referred to the Quitline?: Patient refused referral  Patient has been referred for addiction treatment: Pt. refused referral  Glenis Smoker, LCSW 04/14/2021, 10:00 AM

## 2021-04-14 NOTE — Progress Notes (Signed)
Patient denies SI,HI and AVH. Verbalized understanding of discharge instructions,follow up care and prescriptions.Patient left unit with staff and transported by safe transport.

## 2021-07-02 ENCOUNTER — Emergency Department
Admission: EM | Admit: 2021-07-02 | Discharge: 2021-07-03 | Disposition: A | Discharge status: Home / Self Care | Payer: 59 | Attending: Student in an Organized Health Care Education/Training Program | Admitting: Student in an Organized Health Care Education/Training Program

## 2021-07-02 ENCOUNTER — Other Ambulatory Visit: Payer: Self-pay

## 2021-07-02 DIAGNOSIS — F332 Major depressive disorder, recurrent severe without psychotic features: Secondary | ICD-10-CM | POA: Diagnosis not present

## 2021-07-02 DIAGNOSIS — F17200 Nicotine dependence, unspecified, uncomplicated: Secondary | ICD-10-CM | POA: Insufficient documentation

## 2021-07-02 DIAGNOSIS — Z20822 Contact with and (suspected) exposure to covid-19: Secondary | ICD-10-CM | POA: Insufficient documentation

## 2021-07-02 DIAGNOSIS — I10 Essential (primary) hypertension: Secondary | ICD-10-CM | POA: Insufficient documentation

## 2021-07-02 DIAGNOSIS — F1120 Opioid dependence, uncomplicated: Secondary | ICD-10-CM | POA: Insufficient documentation

## 2021-07-02 DIAGNOSIS — F1110 Opioid abuse, uncomplicated: Secondary | ICD-10-CM

## 2021-07-02 DIAGNOSIS — R45851 Suicidal ideations: Secondary | ICD-10-CM | POA: Insufficient documentation

## 2021-07-02 DIAGNOSIS — Z79899 Other long term (current) drug therapy: Secondary | ICD-10-CM | POA: Insufficient documentation

## 2021-07-02 LAB — RESP PANEL BY RT-PCR (FLU A&B, COVID) ARPGX2
Influenza A by PCR: NEGATIVE
Influenza B by PCR: NEGATIVE
SARS Coronavirus 2 by RT PCR: NEGATIVE

## 2021-07-02 LAB — CBC
HCT: 43.6 % (ref 39.0–52.0)
Hemoglobin: 14.6 g/dL (ref 13.0–17.0)
MCH: 29.7 pg (ref 26.0–34.0)
MCHC: 33.5 g/dL (ref 30.0–36.0)
MCV: 88.6 fL (ref 80.0–100.0)
Platelets: 267 10*3/uL (ref 150–400)
RBC: 4.92 MIL/uL (ref 4.22–5.81)
RDW: 13 % (ref 11.5–15.5)
WBC: 12.9 10*3/uL — ABNORMAL HIGH (ref 4.0–10.5)
nRBC: 0 % (ref 0.0–0.2)

## 2021-07-02 LAB — COMPREHENSIVE METABOLIC PANEL
ALT: 13 U/L (ref 0–44)
AST: 19 U/L (ref 15–41)
Albumin: 4.6 g/dL (ref 3.5–5.0)
Alkaline Phosphatase: 71 U/L (ref 38–126)
Anion gap: 7 (ref 5–15)
BUN: 14 mg/dL (ref 6–20)
CO2: 27 mmol/L (ref 22–32)
Calcium: 9.5 mg/dL (ref 8.9–10.3)
Chloride: 104 mmol/L (ref 98–111)
Creatinine, Ser: 0.8 mg/dL (ref 0.61–1.24)
GFR, Estimated: >60 mL/min (ref >60)
Glucose, Bld: 104 mg/dL — ABNORMAL HIGH (ref 70–99)
Potassium: 4.5 mmol/L (ref 3.5–5.1)
Sodium: 138 mmol/L (ref 135–145)
Total Bilirubin: 0.6 mg/dL (ref 0.3–1.2)
Total Protein: 8.8 g/dL — ABNORMAL HIGH (ref 6.5–8.1)

## 2021-07-02 LAB — URINE DRUG SCREEN, QUALITATIVE (ARMC ONLY)
Amphetamines, Ur Screen: NOT DETECTED
Barbiturates, Ur Screen: NOT DETECTED
Benzodiazepine, Ur Scrn: NOT DETECTED
Cannabinoid 50 Ng, Ur ~~LOC~~: NOT DETECTED
Cocaine Metabolite,Ur ~~LOC~~: NOT DETECTED
MDMA (Ecstasy)Ur Screen: NOT DETECTED
Methadone Scn, Ur: NOT DETECTED
Opiate, Ur Screen: NOT DETECTED
Phencyclidine (PCP) Ur S: NOT DETECTED
Tricyclic, Ur Screen: NOT DETECTED

## 2021-07-02 LAB — SALICYLATE LEVEL: Salicylate Lvl: <7 mg/dL — ABNORMAL LOW (ref 7.0–30.0)

## 2021-07-02 LAB — ETHANOL: Alcohol, Ethyl (B): <10 mg/dL (ref <10)

## 2021-07-02 LAB — ACETAMINOPHEN LEVEL: Acetaminophen (Tylenol), Serum: <10 ug/mL — ABNORMAL LOW (ref 10–30)

## 2021-07-02 NOTE — ED Notes (Signed)
Pt reports he came in due to increasing depression after the loss of his brother a couple of months ago. Pt also states he has used heroine for the last 15 yrs and decided to quit 2 weeks ago "cold [place]" because he just had enough of that life and going to jail. He reports that now the depression is worse that before. Denies HI/AVH on assessment.

## 2021-07-02 NOTE — BH Assessment (Signed)
Comprehensive Clinical Assessment (CCA) Note  07/02/2021 Carlos Rubio [id number]  Chief Complaint: Patient is a 50 year old male presenting to [facility] ED voluntarily due to depression and substance abuse. Per triage note Pt comes with c/o SI. Pt states he recently lost his brother to cancer. Pt states he is just depressed. Pt states heroin use for 10 years. Last year was 2 weeks ago. Pt scared he might relapse. Pt denies any HI. Pt states plan of overdose. During assessment patient appears alert and oriented x4, calm and cooperative. Patient reports "I'm depressed and I'm not on any medications." Patient reports that he was hospitalized here at [facility] before in the past and was not prescribed any medications after he left. Patient reports that he has also been using heroin. Patient reports that he plans on attending [facility] for his substance abuse issues and has a upcoming appointment on Saturday. Patient reports some passive SI but does not  have a plan to want hurt himself, he also denies HI/AH/VH and does not appear to be responding to any internal or external stimuli.  Per Psyc NP Elenore Paddy patient to be observed overnight and reassessed Chief Complaint  Patient presents with   SI   Visit Diagnosis: Depression, Opiate abuse    CCA Screening, Triage and Referral (STR)  Patient Reported Information How did you hear about [place]? Self  Referral name: No data recorded Referral phone number: No data recorded  Whom do you see for routine medical problems? No data recorded Practice/Facility Name: No data recorded Practice/Facility Phone Number: No data recorded Name of Contact: No data recorded Contact Number: No data recorded Contact Fax Number: No data recorded Prescriber Name: No data recorded Prescriber Address (if known): No data recorded  What Is the Reason for Your Visit/Call Today? Patient presents voluntarily due to depression and substance abuse  How Long Has This Been  Causing You Problems? > than 6 months  What Do You Feel Would Help You the Most Today? Alcohol or Drug Use Treatment; Treatment for Depression or other mood problem   Have You Recently Been in Any Inpatient Treatment (Hospital/Detox/Crisis Center/28-Day Program)? No data recorded Name/Location of Program/Hospital:No data recorded How Long Were You There? No data recorded When Were You Discharged? No data recorded  Have You Ever Received Services From [facility] Before? No data recorded Who Do You See at [facility]? No data recorded  Have You Recently Had Any Thoughts About Hurting Yourself? No  Are You Planning to Commit Suicide/Harm Yourself At This time? No   Have you Recently Had Thoughts About Hurting Someone Karolee Ohs? No  Explanation: No data recorded  Have You Used Any Alcohol or Drugs in the Past 24 Hours? Yes  How Long Ago Did You Use Drugs or Alcohol? No data recorded What Did You Use and How Much? Heroin   Do You Currently Have a Therapist/Psychiatrist? No  Name of Therapist/Psychiatrist: No data recorded  Have You Been Recently Discharged From Any Office Practice or Programs? No  Explanation of Discharge From Practice/Program: No data recorded    CCA Screening Triage Referral Assessment Type of Contact: Face-to-Face  Is this Initial or Reassessment? No data recorded Date Telepsych consult ordered in [facility]:  No data recorded Time Telepsych consult ordered in [facility]:  No data recorded  Patient Reported Information Reviewed? No data recorded Patient Left Without Being Seen? No data recorded Reason for Not Completing Assessment: No data recorded  Collateral Involvement: No data recorded  Does Patient Have a Automotive engineer Guardian? No data recorded Name and Contact of Legal Guardian: No data recorded If Minor and Not Living with Parent(s), Who has Custody? No data recorded Is CPS involved or ever been involved? Never  Is APS involved or ever been  involved? Never   Patient Determined To Be At Risk for Harm To Self or Others Based on Review of Patient Reported Information or Presenting Complaint? No  Method: No data recorded Availability of Means: No data recorded Intent: No data recorded Notification Required: No data recorded Additional Information for Danger to Others Potential: No data recorded Additional Comments for Danger to Others Potential: No data recorded Are There Guns or Other Weapons in Your Home? No data recorded Types of Guns/Weapons: No data recorded Are These Weapons Safely Secured?                            No data recorded Who Could Verify You Are Able To Have These Secured: No data recorded Do You Have any Outstanding Charges, Pending Court Dates, Parole/Probation? No data recorded Contacted To Inform of Risk of Harm To Self or Others: No data recorded  Location of Assessment: [facility] ED   Does Patient Present under Involuntary Commitment? No  IVC Papers Initial File Date: No data recorded  [place] of Residence: [place]   Patient Currently Receiving the Following Services: No data recorded  Determination of Need: Emergent (2 hours)   Options For Referral: No data recorded    CCA Biopsychosocial Intake/Chief Complaint:  No data recorded Current Symptoms/Problems: No data recorded  Patient Reported Schizophrenia/Schizoaffective Diagnosis in Past: No   Strengths: Patient is able to communicate  Preferences: No data recorded Abilities: No data recorded  Type of Services Patient Feels are Needed: No data recorded  Initial Clinical Notes/Concerns: No data recorded  Mental Health Symptoms Depression:   Change in energy/activity; Hopelessness; Worthlessness   Duration of Depressive symptoms:  Greater than two weeks   Mania:   None   Anxiety:    None   Psychosis:   None   Duration of Psychotic symptoms: No data recorded  Trauma:   None   Obsessions:   None   Compulsions:    None   Inattention:   None   Hyperactivity/Impulsivity:   None   Oppositional/Defiant Behaviors:   None   Emotional Irregularity:   None   Other Mood/Personality Symptoms:  No data recorded   Mental Status Exam Appearance and self-care  Stature:   Average   Weight:   Average weight   Clothing:   Casual   Grooming:   Normal   Cosmetic use:   None   Posture/gait:   Normal   Motor activity:   Not Remarkable   Sensorium  Attention:   Normal   Concentration:   Normal   Orientation:   X5   Recall/memory:   Normal   Affect and Mood  Affect:   Depressed   Mood:   Depressed   Relating  Eye contact:   None   Facial expression:   Responsive   Attitude toward examiner:   Cooperative   Thought and Language  Speech flow:  Clear and Coherent   Thought content:   Appropriate to Mood and Circumstances   Preoccupation:   None   Hallucinations:   None   Organization:  No data recorded  [employer] of Knowledge:   Fair  Intelligence:   Average   Abstraction:   Normal   Judgement:   Good   Reality Testing:   Realistic   Insight:   Good   Decision Making:   Normal   Social Functioning  Social Maturity:   Responsible   Social Judgement:   Normal   Stress  Stressors:   Teacher, music Ability:   Normal   Skill Deficits:   None   Supports:   Family     Religion: Religion/Spirituality Are You A Religious Person?: No  Leisure/Recreation: Leisure / Recreation Do You Have Hobbies?: No  Exercise/Diet: Exercise/Diet Do You Exercise?: No Have You Gained or Lost A Significant Amount of Weight in the Past Six Months?: No Do You Follow a Special Diet?: No Do You Have Any Trouble Sleeping?: No   CCA Employment/Education Employment/Work Situation: Employment / Work Situation Employment Situation: Unemployed Has Patient ever Been in Passenger transport manager?: No  Education: Education Is Patient  Currently Attending School?: No Did You Have An Individualized Education Program (IIEP): No Did You Have Any Difficulty At [employer]?: No Patient's Education Has Been Impacted by Current Illness: No   CCA Family/Childhood History Family and Relationship History: Family history Marital status: Single Does patient have children?: Yes How many children?: 1 How is patient's relationship with their children?: Unknown  Childhood History:  Childhood History By whom was/is the patient raised?: Mother Did patient suffer any verbal/emotional/physical/sexual abuse as a child?: No Did patient suffer from severe childhood neglect?: No Has patient ever been sexually abused/assaulted/raped as an adolescent or adult?: No Was the patient ever a victim of a crime or a disaster?: No Witnessed domestic violence?: No Has patient been affected by domestic violence as an adult?: No  Child/Adolescent Assessment:     CCA Substance Use Alcohol/Drug Use: Alcohol / Drug Use Pain Medications: See MAR Prescriptions: See MAR Over the Counter: See MAR History of alcohol / drug use?: Yes Withdrawal Symptoms: Blackouts, Change in blood pressure, Agitation Substance #1 Name of Substance 1: Heroin                       ASAM's:  Six Dimensions of Multidimensional Assessment  Dimension 1:  Acute Intoxication and/or Withdrawal Potential:      Dimension 2:  Biomedical Conditions and Complications:      Dimension 3:  Emotional, Behavioral, or Cognitive Conditions and Complications:     Dimension 4:  Readiness to Change:     Dimension 5:  Relapse, Continued use, or Continued Problem Potential:     Dimension 6:  Recovery/Living Environment:     ASAM Severity Score:    ASAM Recommended Level of Treatment:     Substance use Disorder (SUD) Substance Use Disorder (SUD)  Checklist Symptoms of Substance Use: Continued use despite having a persistent/recurrent physical/psychological problem  caused/exacerbated by use, Continued use despite persistent or recurrent social, interpersonal problems, caused or exacerbated by use, Presence of craving or strong urge to use, Recurrent use that results in a failure to fulfill major role obligations (work, school, home), Repeated use in physically hazardous situations, Persistent desire or unsuccessful efforts to cut down or control use, Social, occupational, recreational activities given up or reduced due to use  Recommendations for Services/Supports/Treatments:    DSM5 Diagnoses: Patient Active Problem List   Diagnosis Date Noted   MDD (major depressive disorder), recurrent episode, severe ([place]) 04/10/2021   Opiate abuse, continuous ([place]) 04/09/2021   Closed fracture of left distal  fibula 04/29/2020   Lisfranc dislocation, left, initial encounter 04/29/2020   Tobacco use disorder 04/16/2020   Hypertension 10/03/2017   Opioid dependence, uncomplicated ([place]) Q000111Q    Patient Centered Plan: Patient is on the following Treatment Plan(s):  Depression and Substance Abuse   Referrals to Alternative Service(s): Referred to Alternative Service(s):   Place:   Date:   Time:    Referred to Alternative Service(s):   Place:   Date:   Time:    Referred to Alternative Service(s):   Place:   Date:   Time:    Referred to Alternative Service(s):   Place:   Date:   Time:     Takeela Peil A Marsean Elkhatib, LCAS-A

## 2021-07-02 NOTE — ED Provider Notes (Signed)
[facility] Emergency Department Provider Note    None    (approximate)  I have reviewed the triage vital signs and the nursing notes.   HISTORY  Chief Complaint SI    HPI Carlos Rubio is a 50 y.o. male with a history of heroin dependence as well as depression presents to the ER for evaluation of feeling very depressed after the recent loss of his brother and feeling suicidal.  States he does not want to go home because he is worried that he can go home and overdose.  States that if he did go home he would plan to overdose because he feels so depressed.  States he was seen at [facility] today and directed to the ER.  He denies any pain no congestion.  No fevers.  Past Medical History:  Diagnosis Date   Back pain    Hypertension    Kidney stones    Leg fracture    Low testosterone    Opioid type dependence, abuse ([place])    Opioid withdrawal ([place])    Family History  Problem Relation Age of Onset   Anxiety disorder Mother    Hypertension Mother    ADD / ADHD Son    Past Surgical History:  Procedure Laterality Date   FEMUR IM NAIL  11/07/2011   Procedure: INTRAMEDULLARY (IM) NAIL FEMORAL;  Surgeon: Melina Schools, MD;  Location: [facility];  Service: Orthopedics;  Laterality: Left;   HERNIA REPAIR     Patient Active Problem List   Diagnosis Date Noted   MDD (major depressive disorder), recurrent episode, severe ([place]) 04/10/2021   Opiate abuse, continuous ([place]) 04/09/2021   Closed fracture of left distal fibula 04/29/2020   Lisfranc dislocation, left, initial encounter 04/29/2020   Tobacco use disorder 04/16/2020   Hypertension 10/03/2017   Opioid dependence, uncomplicated ([place]) Q000111Q      Prior to Admission medications   Medication Sig Start Date End Date Taking? Authorizing Provider  lisinopril (ZESTRIL) 30 MG tablet Take 1 tablet (30 mg total) by mouth daily. 04/15/21   Salley Scarlet, MD  QUEtiapine (SEROQUEL) 100 MG tablet Take 1 tablet (100  mg total) by mouth at bedtime. 04/14/21   Salley Scarlet, MD    Allergies Benadryl [diphenhydramine], Diphenhydramine hcl, and Iodinated diagnostic agents    Social History Social History   Tobacco Use   Smoking status: Some Days    Packs/day: 1.00    Years: 15.00    Pack years: 15.00    Types: Cigarettes   Smokeless tobacco: Never  Vaping Use   Vaping Use: Never used  Substance Use Topics   Alcohol use: Not Currently    Comment: Socially   Drug use: Yes    Types: IV, Heroin    Comment: two weeks ago    Review of Systems Patient denies headaches, rhinorrhea, blurry vision, numbness, shortness of breath, chest pain, edema, cough, abdominal pain, nausea, vomiting, diarrhea, dysuria, fevers, rashes or hallucinations unless otherwise stated above in HPI. ____________________________________________   PHYSICAL EXAM:  VITAL SIGNS: Vitals:   07/02/21 1808  BP: (!) 176/112  Pulse: 84  Resp: 18  Temp: 98 F (36.7 C)  SpO2: 99%    Constitutional: Alert and oriented.  Eyes: Conjunctivae are normal.  Head: Atraumatic. Nose: No congestion/rhinnorhea. Mouth/Throat: Mucous membranes are moist.   Neck: No stridor. Painless ROM.  Cardiovascular: Normal rate, regular rhythm. Grossly normal heart sounds.  Good peripheral circulation. Respiratory: Normal respiratory effort.  No  retractions. Lungs CTAB. Gastrointestinal: Soft and nontender. No distention. No abdominal bruits. No CVA tenderness. Genitourinary:  Musculoskeletal: No lower extremity tenderness nor edema.  No joint effusions. Neurologic:  Normal speech and language. No gross focal neurologic deficits are appreciated. No facial droop Skin:  Skin is warm, dry and intact. No rash noted. Psychiatric: Mood and affect are normal. Speech and behavior are normal.  ____________________________________________   LABS (all labs ordered are listed, but only abnormal results are displayed)  Results for orders placed or  performed during the hospital encounter of 07/02/21 (from the past 24 hour(s))  Comprehensive metabolic panel     Status: Abnormal   Collection Time: 07/02/21  6:15 PM  Result Value Ref Range   Sodium 138 135 - 145 mmol/L   Potassium 4.5 3.5 - 5.1 mmol/L   Chloride 104 98 - 111 mmol/L   CO2 27 22 - 32 mmol/L   Glucose, Bld 104 (H) 70 - 99 mg/dL   BUN 14 6 - 20 mg/dL   Creatinine, Ser 0.80 0.61 - 1.24 mg/dL   Calcium 9.5 8.9 - 10.3 mg/dL   Total Protein 8.8 (H) 6.5 - 8.1 g/dL   Albumin 4.6 3.5 - 5.0 g/dL   AST 19 15 - 41 U/L   ALT 13 0 - 44 U/L   Alkaline Phosphatase 71 38 - 126 U/L   Total Bilirubin 0.6 0.3 - 1.2 mg/dL   GFR, Estimated >60 >60 mL/min   Anion gap 7 5 - 15  Ethanol     Status: None   Collection Time: 07/02/21  6:15 PM  Result Value Ref Range   Alcohol, Ethyl (B) <10 <10 mg/dL  cbc     Status: Abnormal   Collection Time: 07/02/21  6:15 PM  Result Value Ref Range   WBC 12.9 (H) 4.0 - 10.5 K/uL   RBC 4.92 4.22 - 5.81 MIL/uL   Hemoglobin 14.6 13.0 - 17.0 g/dL   HCT 43.6 39.0 - 52.0 %   MCV 88.6 80.0 - 100.0 fL   MCH 29.7 26.0 - 34.0 pg   MCHC 33.5 30.0 - 36.0 g/dL   RDW 13.0 11.5 - 15.5 %   Platelets 267 150 - 400 K/uL   nRBC 0.0 0.0 - 0.2 %   ____________________________________________ ____________________________________________  RADIOLOGY   ____________________________________________   PROCEDURES  Procedure(s) performed:  Procedures    Critical Care performed: no ____________________________________________   INITIAL IMPRESSION / ASSESSMENT AND PLAN / ED COURSE  Pertinent labs & imaging results that were available during my care of the patient were reviewed by me and considered in my medical decision making (see chart for details).   DDX: Psychosis, delirium, medication effect, noncompliance, polysubstance abuse, Si, Hi, depression   Carlos Rubio is a 50 y.o. who presents to the ED with for evaluation of SI.  Patient has psych history  of heroin dependence.  Laboratory testing was ordered to evaluation for underlying electrolyte derangement or signs of underlying organic pathology to explain today's presentation.  Based on history and physical and laboratory evaluation, it appears that the patient's presentation is 2/2 underlying psychiatric disorder and will require further evaluation and management by inpatient psychiatry.    Disposition pending psychiatric evaluation.  The patient has been placed in psychiatric observation due to the need to provide a safe environment for the patient while obtaining psychiatric consultation and evaluation, as well as ongoing medical and medication management to treat the patient's condition.  The patient has not been  placed under full IVC at this time.     The patient was evaluated in Emergency Department today for the symptoms described in the history of present illness. He/she was evaluated in the context of the global COVID-19 pandemic, which necessitated consideration that the patient might be at risk for infection with the SARS-CoV-2 virus that causes COVID-19. Institutional protocols and algorithms that pertain to the evaluation of patients at risk for COVID-19 are in a state of rapid change based on information released by regulatory bodies including the CDC and federal and state organizations. These policies and algorithms were followed during the patient's care in the ED.  As part of my medical decision making, I reviewed the following data within the electronic MEDICAL RECORD NUMBER Nursing notes reviewed and incorporated, Labs reviewed, notes from prior ED visits and [place] Controlled Substance Database   ____________________________________________   FINAL CLINICAL IMPRESSION(S) / ED DIAGNOSES  Final diagnoses:  Suicidal ideations      NEW MEDICATIONS STARTED DURING THIS VISIT:  New Prescriptions   No medications on file     Note:  This document was prepared using Dragon voice  recognition software and may include unintentional dictation errors.    Willy Eddy, MD 07/02/21 [phone]

## 2021-07-02 NOTE — Consult Note (Signed)
[facility] Face-to-Face Psychiatry Consult   Reason for Consult: SI Referring Physician: Dr. Roxan Hockey Patient Identification: Carlos Rubio MRN:  [id number] Principal Diagnosis: <principal problem not specified> Diagnosis:  Active Problems:   Hypertension   Opiate abuse, continuous (HCC)   MDD (major depressive disorder), recurrent episode, severe (HCC)   Opioid dependence, uncomplicated (HCC)   Tobacco use disorder   Total Time spent with patient: 1 hour  Subjective: " I have never attempted suicide before." Carlos Rubio is a 50 y.o. male patient presented to [facility] ED voluntarily due to depression and substance abuse. The patient shared that he recently lost his brother to cancer and has been feeling depressed and having passive suicidal ideations. He shared his depression, and he is not on any antidepressants, nor is he in therapy. The patient voiced he has never taken any antidepressants. "Nobody has ever prescribed it to me." I would argue with that statement. The patient is a substance abuser, and his drug of choice is heroin. The patient shared his last use was two weeks ago. He shared that he had been on heroin for ten years. The patient stated he went to [facility] in 2019 and 2020. He discussed once he graduated from the program, he was "clean" for a few months and then relapsed. The patient reports that he was hospitalized here at [facility] before and was not prescribed any medications after he left. The patient says he plans to attend [facility] for his substance abuse issues and has an upcoming appointment on Saturday.  The patient was seen face-to-face by this provider; the chart was reviewed and consulted with Dr. Roxan Hockey on 07/02/2021 due to the patient's care. It was discussed with the EDP that the patient remained under observation overnight and will be reassessed in the a.m. to determine if he meets the criteria for psychiatric inpatient admission; he could be discharged home. On evaluation, the  patient is alert and oriented x4, calm and cooperative, and mood-congruent with affect.  The patient does not appear to be responding to internal or external stimuli. Neither is the patient presenting with any delusional thinking. The patient is not presenting with any psychotic or paranoid behaviors. The patient denies auditory or visual hallucinations. The patient denies homicidal or self-harm ideations. The patient voiced passive suicidal ideations. He states, "I will never willingly harm myself.   HPI: Per Dr. Roxan Hockey, Carlos Rubio is a 50 y.o. male with a history of heroin dependence as well as depression presents to the ER for evaluation of feeling very depressed after the recent loss of his brother and feeling suicidal.  States he does not want to go home because he is worried that he can go home and overdose.  States that if he did go home he would plan to overdose because he feels so depressed.  States he was seen at [facility] today and directed to the ER.  He denies any pain no congestion.  No fevers.  Past Psychiatric History: Brother-Substance used disorder  Risk to Self:   Risk to Others:   Prior Inpatient Therapy:   Prior Outpatient Therapy:    Past Medical History:  Past Medical History:  Diagnosis Date   Back pain    Hypertension    Kidney stones    Leg fracture    Low testosterone    Opioid type dependence, abuse (HCC)    Opioid withdrawal (HCC)     Past Surgical History:  Procedure Laterality Date   FEMUR IM NAIL  11/07/2011   Procedure: INTRAMEDULLARY (IM) NAIL FEMORAL;  Surgeon: Venita Lick, MD;  Location: [facility] OR;  Service: Orthopedics;  Laterality: Left;   HERNIA REPAIR     Family History:  Family History  Problem Relation Age of Onset   Anxiety disorder Mother    Hypertension Mother    ADD / ADHD Son    Family Psychiatric  History:  Social History:  Social History   Substance and Sexual Activity  Alcohol Use Not Currently   Comment: Socially     Social  History   Substance and Sexual Activity  Drug Use Yes   Types: IV, Heroin   Comment: two weeks ago    Social History   Socioeconomic History   Marital status: Widowed    Spouse name: Not on file   Number of children: Not on file   Years of education: Not on file   Highest education level: Not on file  Occupational History   Not on file  Tobacco Use   Smoking status: Some Days    Packs/day: 1.00    Years: 15.00    Pack years: 15.00    Types: Cigarettes   Smokeless tobacco: Never  Vaping Use   Vaping Use: Never used  Substance and Sexual Activity   Alcohol use: Not Currently    Comment: Socially   Drug use: Yes    Types: IV, Heroin    Comment: two weeks ago   Sexual activity: Not Currently  Other Topics Concern   Not on file  Social History Narrative   Not on file   Social Determinants of Health   Financial Resource Strain: Not on file  Food Insecurity: Not on file  Transportation Needs: Not on file  Physical Activity: Not on file  Stress: Not on file  Social Connections: Not on file   Additional Social History:    Allergies:   Allergies  Allergen Reactions   Benadryl [Diphenhydramine]     Hyperactivity   Diphenhydramine Hcl Other (See Comments)    Elevated BP   Iodinated Diagnostic Agents Other (See Comments)    "paralyzed, couldn't move or breathe"    Labs:  Results for orders placed or performed during the hospital encounter of 07/02/21 (from the past 48 hour(s))  Urine Drug Screen, Qualitative     Status: None   Collection Time: 07/02/21  6:12 PM  Result Value Ref Range   Tricyclic, Ur Screen NONE DETECTED NONE DETECTED   Amphetamines, Ur Screen NONE DETECTED NONE DETECTED   MDMA (Ecstasy)Ur Screen NONE DETECTED NONE DETECTED   Cocaine Metabolite,Ur [place] NONE DETECTED NONE DETECTED   Opiate, Ur Screen NONE DETECTED NONE DETECTED   Phencyclidine (PCP) Ur S NONE DETECTED NONE DETECTED   Cannabinoid 50 Ng, Ur [place] NONE DETECTED NONE DETECTED    Barbiturates, Ur Screen NONE DETECTED NONE DETECTED   Benzodiazepine, Ur Scrn NONE DETECTED NONE DETECTED   Methadone Scn, Ur NONE DETECTED NONE DETECTED    Comment: (NOTE) Tricyclics + metabolites, urine    Cutoff 1000 ng/mL Amphetamines + metabolites, urine  Cutoff 1000 ng/mL MDMA (Ecstasy), urine              Cutoff 500 ng/mL Cocaine Metabolite, urine          Cutoff 300 ng/mL Opiate + metabolites, urine        Cutoff 300 ng/mL Phencyclidine (PCP), urine         Cutoff 25 ng/mL Cannabinoid, urine  Cutoff 50 ng/mL Barbiturates + metabolites, urine  Cutoff 200 ng/mL Benzodiazepine, urine              Cutoff 200 ng/mL Methadone, urine                   Cutoff 300 ng/mL  The urine drug screen provides only a preliminary, unconfirmed analytical test result and should not be used for non-medical purposes. Clinical consideration and professional judgment should be applied to any positive drug screen result due to possible interfering substances. A more specific alternate chemical method must be used in order to obtain a confirmed analytical result. Gas chromatography / mass spectrometry (GC/MS) is the preferred confirm atory method. Performed at [facility], [address] [place], [place] [address]   Comprehensive metabolic panel     Status: Abnormal   Collection Time: 07/02/21  6:15 PM  Result Value Ref Range   Sodium 138 135 - 145 mmol/L   Potassium 4.5 3.5 - 5.1 mmol/L   Chloride 104 98 - 111 mmol/L   CO2 27 22 - 32 mmol/L   Glucose, Bld 104 (H) 70 - 99 mg/dL    Comment: Glucose reference range applies only to samples taken after fasting for at least 8 hours.   BUN 14 6 - 20 mg/dL   Creatinine, Ser 1.66 0.61 - 1.24 mg/dL   Calcium 9.5 8.9 - 06.3 mg/dL   Total Protein 8.8 (H) 6.5 - 8.1 g/dL   Albumin 4.6 3.5 - 5.0 g/dL   AST 19 15 - 41 U/L   ALT 13 0 - 44 U/L   Alkaline Phosphatase 71 38 - 126 U/L   Total Bilirubin 0.6 0.3 - 1.2 mg/dL   GFR,  Estimated >01 >60 mL/min    Comment: (NOTE) Calculated using the CKD-EPI Creatinine Equation (2021)    Anion gap 7 5 - 15    Comment: Performed at [facility], [address] [place], [place] [address]  Ethanol     Status: None   Collection Time: 07/02/21  6:15 PM  Result Value Ref Range   Alcohol, Ethyl (B) <10 <10 mg/dL    Comment: (NOTE) Lowest detectable limit for serum alcohol is 10 mg/dL.  For medical purposes only. Performed at [facility], [address] [place], [place] [address]   Salicylate level     Status: Abnormal   Collection Time: 07/02/21  6:15 PM  Result Value Ref Range   Salicylate Lvl <7.0 (L) 7.0 - 30.0 mg/dL    Comment: Performed at [facility], [address] [place], [place] [address]  Acetaminophen level     Status: Abnormal   Collection Time: 07/02/21  6:15 PM  Result Value Ref Range   Acetaminophen (Tylenol), Serum <10 (L) 10 - 30 ug/mL    Comment: (NOTE) Therapeutic concentrations vary significantly. A range of 10-30 ug/mL  may be an effective concentration for many patients. However, some  are best treated at concentrations outside of this range. Acetaminophen concentrations >150 ug/mL at 4 hours after ingestion  and >50 ug/mL at 12 hours after ingestion are often associated with  toxic reactions.  Performed at [facility], [address] [place], [place] [address]   cbc     Status: Abnormal   Collection Time: 07/02/21  6:15 PM  Result Value Ref Range   WBC 12.9 (H) 4.0 - 10.5 K/uL   RBC 4.92 4.22 - 5.81 MIL/uL   Hemoglobin 14.6 13.0 -  17.0 g/dL   HCT 86.7 67.2 - 09.4 %   MCV 88.6 80.0 - 100.0 fL   MCH 29.7 26.0 - 34.0 pg   MCHC 33.5 30.0 - 36.0 g/dL   RDW 70.9 62.8 - 36.6 %   Platelets 267 150 - 400 K/uL   nRBC 0.0 0.0 - 0.2 %    Comment: Performed at [facility], [address] [place], [place] [address]  Resp Panel by RT-PCR (Flu A&B, Covid) Nasopharyngeal Swab     Status:  None   Collection Time: 07/02/21  6:15 PM   Specimen: Nasopharyngeal Swab; Nasopharyngeal(NP) swabs in vial transport medium  Result Value Ref Range   SARS Coronavirus 2 by RT PCR NEGATIVE NEGATIVE    Comment: (NOTE) SARS-CoV-2 target nucleic acids are NOT DETECTED.  The SARS-CoV-2 RNA is generally detectable in upper respiratory specimens during the acute phase of infection. The lowest concentration of SARS-CoV-2 viral copies this assay can detect is 138 copies/mL. A negative result does not preclude SARS-Cov-2 infection and should not be used as the sole basis for treatment or other patient management decisions. A negative result may occur with  improper specimen collection/handling, submission of specimen other than nasopharyngeal swab, presence of viral mutation(s) within the areas targeted by this assay, and inadequate number of viral copies(<138 copies/mL). A negative result must be combined with clinical observations, patient history, and epidemiological information. The expected result is Negative.  Fact Sheet for Patients:  BloggerCourse.com  Fact Sheet for Healthcare Providers:  SeriousBroker.it  This test is no t yet approved or cleared by the [place] FDA and  has been authorized for detection and/or diagnosis of SARS-CoV-2 by FDA under an Emergency Use Authorization (EUA). This EUA will remain  in effect (meaning this test can be used) for the duration of the COVID-19 declaration under Section 564(b)(1) of the Act, 21 U.S.C.section 360bbb-3(b)(1), unless the authorization is terminated  or revoked sooner.       Influenza A by PCR NEGATIVE NEGATIVE   Influenza B by PCR NEGATIVE NEGATIVE    Comment: (NOTE) The Xpert Xpress SARS-CoV-2/FLU/RSV plus assay is intended as an aid in the diagnosis of influenza from Nasopharyngeal swab specimens and should not be used as a sole basis for treatment. Nasal washings  and aspirates are unacceptable for Xpert Xpress SARS-CoV-2/FLU/RSV testing.  Fact Sheet for Patients: BloggerCourse.com  Fact Sheet for Healthcare Providers: SeriousBroker.it  This test is not yet approved or cleared by the [place] FDA and has been authorized for detection and/or diagnosis of SARS-CoV-2 by FDA under an Emergency Use Authorization (EUA). This EUA will remain in effect (meaning this test can be used) for the duration of the COVID-19 declaration under Section 564(b)(1) of the Act, 21 U.S.C. section 360bbb-3(b)(1), unless the authorization is terminated or revoked.  Performed at [facility], [address] [place], [place] [address]     No current facility-administered medications for this encounter.   Current Outpatient Medications  Medication Sig Dispense Refill   lisinopril (ZESTRIL) 30 MG tablet Take 1 tablet (30 mg total) by mouth daily. 7 tablet 0   QUEtiapine (SEROQUEL) 100 MG tablet Take 1 tablet (100 mg total) by mouth at bedtime. 7 tablet 0    Musculoskeletal: Strength & Muscle Tone: within normal limits Gait & Station: normal Patient leans: N/A  Psychiatric Specialty Exam:  Presentation  General Appearance: Appropriate for Environment  Eye Contact:Good  Speech:Clear and Coherent  Speech Volume:Normal  Handedness:Right   Mood  and Affect  Mood:Euthymic  Affect:Congruent   Thought Process  Thought Processes:Coherent; Goal Directed  Descriptions of Associations:Intact  Orientation:Full (Time, Place and Person)  Thought Content:Logical  History of Schizophrenia/Schizoaffective disorder:No  Duration of Psychotic Symptoms:No data recorded Hallucinations:Hallucinations: None  Ideas of Reference:None  Suicidal Thoughts:Suicidal Thoughts: No  Homicidal Thoughts:Homicidal Thoughts: No   Sensorium  Memory:Immediate Good; Recent Good; Remote  Good  Judgment:Intact  Insight:Present   Executive Functions  Concentration:Good  Attention Span:Good  Recall:Good  Fund of Knowledge:Good  Language:Good   Psychomotor Activity  Psychomotor Activity:Psychomotor Activity: Normal   Assets  Assets:Communication Skills; Desire for Improvement; Physical Health; Resilience; Social Support   Sleep  Sleep:Sleep: Good Number of Hours of Sleep: 8   Physical Exam: Physical Exam Vitals and nursing note reviewed.  Constitutional:      Appearance: Normal appearance. He is normal weight.  HENT:     Head: Normocephalic and atraumatic.     Nose: Nose normal.     Mouth/Throat:     Mouth: Mucous membranes are moist.  Cardiovascular:     Rate and Rhythm: Normal rate.     Pulses: Normal pulses.  Pulmonary:     Effort: Pulmonary effort is normal.  Musculoskeletal:        General: Normal range of motion.     Cervical back: Normal range of motion and neck supple.  Neurological:     General: No focal deficit present.     Mental Status: He is alert and oriented to person, place, and time. Mental status is at baseline.  Psychiatric:        Attention and Perception: Attention normal.        Mood and Affect: Mood is depressed. Affect is blunt.        Speech: Speech normal.        Behavior: Behavior normal. Behavior is cooperative.        Thought Content: Thought content normal.        Cognition and Memory: Cognition and memory normal.        Judgment: Judgment normal.   Review of Systems  Psychiatric/Behavioral:  Positive for depression and substance abuse. The patient is nervous/anxious.   All other systems reviewed and are negative. Blood pressure (!) 145/98, pulse 83, temperature 98.9 F (37.2 C), temperature source Oral, resp. rate 18, SpO2 99 %. There is no height or weight on file to calculate BMI.  Treatment Plan Summary: Daily contact with patient to assess and evaluate symptoms and progress in treatment and Plan The  patient remained under observation overnight and will be reassessed in the a.m. to determine if he meets the criteria for psychiatric inpatient admission; he could be discharged home.   Disposition: No evidence of imminent risk to self or others at present.   Supportive therapy provided about ongoing stressors. The patient remained under observation overnight and will be reassessed in the a.m. to determine if he meets the criteria for psychiatric inpatient admission; he could be discharged home.  Gillermo Murdoch, NP 07/02/2021 10:06 PM

## 2021-07-02 NOTE — ED Notes (Signed)
Pt dressed out into hospital attire. Pt's belongings to include: 1 Black flannel shirt 1 back shirt 1 gray shorts 1 blue jeans 1 brown belt 2 black socks 2 black shoes

## 2021-07-02 NOTE — ED Triage Notes (Signed)
Pt comes with c/o SI. Pt states he recently lost his brother to cancer. Pt states he is just depressed. Pt states heroin use for 10 years.  Last year was 2 weeks ago. Pt scared he might relapse.  Pt denies any HI. Pt states plan of overdose.

## 2021-07-02 NOTE — ED Notes (Signed)

## 2021-07-03 DIAGNOSIS — F332 Major depressive disorder, recurrent severe without psychotic features: Secondary | ICD-10-CM | POA: Diagnosis not present

## 2021-07-03 NOTE — ED Notes (Signed)
NP at bedside.

## 2021-07-03 NOTE — ED Notes (Signed)
Pt discharged home. Refused VS.  Denies SI/HI.

## 2021-07-03 NOTE — Consult Note (Signed)
[facility] Face-to-Face Psychiatry Consult   Reason for Consult:  re-evaluation Referring Physician:  EDP Patient Identification: Carlos Rubio MRN:  [id number] Principal Diagnosis: <principal problem not specified> Diagnosis:  Active Problems:   Hypertension   Opiate abuse, continuous (HCC)   MDD (major depressive disorder), recurrent episode, severe (HCC)   Opioid dependence, uncomplicated (HCC)   Tobacco use disorder   Total Time spent with patient: 30 minutes  Subjective:   Carlos Rubio is a 50 y.o. male patient admitted with "I want help for my depression."   HPI:  Re-assessment of patient is unchanged since last evening. He states that he slept well. He is requesting in-patient hospitalization because he is "afraid if I go home I will use heroin again." He states that he last used 2 weeks ago and "stopped by himself." His concern is that people he used to hang out with keep coming around his house and he is afraid that he will succumb to using. Encouragement and support offered to patient. We do not have a bed available at this time. He has a phone appointment with [facility] on 11/19.   Patient continues to endorse depression. He has passive suicidal thoughts without a plan. Denies homicidal ideations, paranoia, auditory or visual hallucinations. Has had death of brother, and other losses (wife died 7 years ago) that he attributes to heroin use. He states that his depression pushes him toward heroin use. He is requesting inpatient hospitalization for his depression and will consider antidepressants. Patient does not meet criteria for inpatient hospitalization.   Past Psychiatric History: Depression, opiate dependence. See previous  Risk to Self:   Risk to Others:   Prior Inpatient Therapy:   Prior Outpatient Therapy:    Past Medical History:  Past Medical History:  Diagnosis Date   Back pain    Hypertension    Kidney stones    Leg fracture    Low testosterone    Opioid type  dependence, abuse (HCC)    Opioid withdrawal (HCC)     Past Surgical History:  Procedure Laterality Date   FEMUR IM NAIL  11/07/2011   Procedure: INTRAMEDULLARY (IM) NAIL FEMORAL;  Surgeon: Venita Lick, MD;  Location: [facility] OR;  Service: Orthopedics;  Laterality: Left;   HERNIA REPAIR     Family History:  Family History  Problem Relation Age of Onset   Anxiety disorder Mother    Hypertension Mother    ADD / ADHD Son    Family Psychiatric  History: see previous Social History:  Social History   Substance and Sexual Activity  Alcohol Use Not Currently   Comment: Socially     Social History   Substance and Sexual Activity  Drug Use Yes   Types: IV, Heroin   Comment: two weeks ago    Social History   Socioeconomic History   Marital status: Widowed    Spouse name: Not on file   Number of children: Not on file   Years of education: Not on file   Highest education level: Not on file  Occupational History   Not on file  Tobacco Use   Smoking status: Some Days    Packs/day: 1.00    Years: 15.00    Pack years: 15.00    Types: Cigarettes   Smokeless tobacco: Never  Vaping Use   Vaping Use: Never used  Substance and Sexual Activity   Alcohol use: Not Currently    Comment: Socially   Drug use: Yes    Types:  IV, Heroin    Comment: two weeks ago   Sexual activity: Not Currently  Other Topics Concern   Not on file  Social History Narrative   Not on file   Social Determinants of Health   Financial Resource Strain: Not on file  Food Insecurity: Not on file  Transportation Needs: Not on file  Physical Activity: Not on file  Stress: Not on file  Social Connections: Not on file   Additional Social History:    Allergies:   Allergies  Allergen Reactions   Benadryl [Diphenhydramine]     Hyperactivity   Diphenhydramine Hcl Other (See Comments)    Elevated BP   Iodinated Diagnostic Agents Other (See Comments)    "paralyzed, couldn't move or breathe"    Labs:   Results for orders placed or performed during the hospital encounter of 07/02/21 (from the past 48 hour(s))  Urine Drug Screen, Qualitative     Status: None   Collection Time: 07/02/21  6:12 PM  Result Value Ref Range   Tricyclic, Ur Screen NONE DETECTED NONE DETECTED   Amphetamines, Ur Screen NONE DETECTED NONE DETECTED   MDMA (Ecstasy)Ur Screen NONE DETECTED NONE DETECTED   Cocaine Metabolite,Ur [place] NONE DETECTED NONE DETECTED   Opiate, Ur Screen NONE DETECTED NONE DETECTED   Phencyclidine (PCP) Ur S NONE DETECTED NONE DETECTED   Cannabinoid 50 Ng, Ur [place] NONE DETECTED NONE DETECTED   Barbiturates, Ur Screen NONE DETECTED NONE DETECTED   Benzodiazepine, Ur Scrn NONE DETECTED NONE DETECTED   Methadone Scn, Ur NONE DETECTED NONE DETECTED    Comment: (NOTE) Tricyclics + metabolites, urine    Cutoff 1000 ng/mL Amphetamines + metabolites, urine  Cutoff 1000 ng/mL MDMA (Ecstasy), urine              Cutoff 500 ng/mL Cocaine Metabolite, urine          Cutoff 300 ng/mL Opiate + metabolites, urine        Cutoff 300 ng/mL Phencyclidine (PCP), urine         Cutoff 25 ng/mL Cannabinoid, urine                 Cutoff 50 ng/mL Barbiturates + metabolites, urine  Cutoff 200 ng/mL Benzodiazepine, urine              Cutoff 200 ng/mL Methadone, urine                   Cutoff 300 ng/mL  The urine drug screen provides only a preliminary, unconfirmed analytical test result and should not be used for non-medical purposes. Clinical consideration and professional judgment should be applied to any positive drug screen result due to possible interfering substances. A more specific alternate chemical method must be used in order to obtain a confirmed analytical result. Gas chromatography / mass spectrometry (GC/MS) is the preferred confirm atory method. Performed at [facility], [address] [place], [place] [address]   Comprehensive metabolic panel     Status: Abnormal   Collection Time:  07/02/21  6:15 PM  Result Value Ref Range   Sodium 138 135 - 145 mmol/L   Potassium 4.5 3.5 - 5.1 mmol/L   Chloride 104 98 - 111 mmol/L   CO2 27 22 - 32 mmol/L   Glucose, Bld 104 (H) 70 - 99 mg/dL    Comment: Glucose reference range applies only to samples taken after fasting for at least 8 hours.   BUN 14 6 - 20 mg/dL  Creatinine, Ser 0.80 0.61 - 1.24 mg/dL   Calcium 9.5 8.9 - 80.0 mg/dL   Total Protein 8.8 (H) 6.5 - 8.1 g/dL   Albumin 4.6 3.5 - 5.0 g/dL   AST 19 15 - 41 U/L   ALT 13 0 - 44 U/L   Alkaline Phosphatase 71 38 - 126 U/L   Total Bilirubin 0.6 0.3 - 1.2 mg/dL   GFR, Estimated >34 >91 mL/min    Comment: (NOTE) Calculated using the CKD-EPI Creatinine Equation (2021)    Anion gap 7 5 - 15    Comment: Performed at [facility], [address] [place], [place] [address]  Ethanol     Status: None   Collection Time: 07/02/21  6:15 PM  Result Value Ref Range   Alcohol, Ethyl (B) <10 <10 mg/dL    Comment: (NOTE) Lowest detectable limit for serum alcohol is 10 mg/dL.  For medical purposes only. Performed at [facility], [address] [place], [place] [address]   Salicylate level     Status: Abnormal   Collection Time: 07/02/21  6:15 PM  Result Value Ref Range   Salicylate Lvl <7.0 (L) 7.0 - 30.0 mg/dL    Comment: Performed at [facility], [address] [place], [place] [address]  Acetaminophen level     Status: Abnormal   Collection Time: 07/02/21  6:15 PM  Result Value Ref Range   Acetaminophen (Tylenol), Serum <10 (L) 10 - 30 ug/mL    Comment: (NOTE) Therapeutic concentrations vary significantly. A range of 10-30 ug/mL  may be an effective concentration for many patients. However, some  are best treated at concentrations outside of this range. Acetaminophen concentrations >150 ug/mL at 4 hours after ingestion  and >50 ug/mL at 12 hours after ingestion are often associated with  toxic reactions.  Performed at [facility], [address] [place], [place] [address]   cbc     Status: Abnormal   Collection Time: 07/02/21  6:15 PM  Result Value Ref Range   WBC 12.9 (H) 4.0 - 10.5 K/uL   RBC 4.92 4.22 - 5.81 MIL/uL   Hemoglobin 14.6 13.0 - 17.0 g/dL   HCT 82.7 07.8 - 67.5 %   MCV 88.6 80.0 - 100.0 fL   MCH 29.7 26.0 - 34.0 pg   MCHC 33.5 30.0 - 36.0 g/dL   RDW 44.9 20.1 - 00.7 %   Platelets 267 150 - 400 K/uL   nRBC 0.0 0.0 - 0.2 %    Comment: Performed at [facility], [address], [place], [place] [address]  Resp Panel by RT-PCR (Flu A&B, Covid) Nasopharyngeal Swab     Status: None   Collection Time: 07/02/21  6:15 PM   Specimen: Nasopharyngeal Swab; Nasopharyngeal(NP) swabs in vial transport medium  Result Value Ref Range   SARS Coronavirus 2 by RT PCR NEGATIVE NEGATIVE    Comment: (NOTE) SARS-CoV-2 target nucleic acids are NOT DETECTED.  The SARS-CoV-2 RNA is generally detectable in upper respiratory specimens during the acute phase of infection. The lowest concentration of SARS-CoV-2 viral copies this assay can detect is 138 copies/mL. A negative result does not preclude SARS-Cov-2 infection and should not be used as the sole basis for treatment or other patient management decisions. A negative result may occur with  improper specimen collection/handling, submission of specimen other than nasopharyngeal swab, presence of viral mutation(s) within the areas targeted by this assay, and inadequate number of viral copies(<138 copies/mL). A negative  result must be combined with clinical observations, patient history, and epidemiological information. The expected result is Negative.  Fact Sheet for Patients:  BloggerCourse.com  Fact Sheet for Healthcare Providers:  SeriousBroker.it  This test is no t yet approved or cleared by the [place] FDA and  has been authorized for detection and/or diagnosis of SARS-CoV-2  by FDA under an Emergency Use Authorization (EUA). This EUA will remain  in effect (meaning this test can be used) for the duration of the COVID-19 declaration under Section 564(b)(1) of the Act, 21 U.S.C.section 360bbb-3(b)(1), unless the authorization is terminated  or revoked sooner.       Influenza A by PCR NEGATIVE NEGATIVE   Influenza B by PCR NEGATIVE NEGATIVE    Comment: (NOTE) The Xpert Xpress SARS-CoV-2/FLU/RSV plus assay is intended as an aid in the diagnosis of influenza from Nasopharyngeal swab specimens and should not be used as a sole basis for treatment. Nasal washings and aspirates are unacceptable for Xpert Xpress SARS-CoV-2/FLU/RSV testing.  Fact Sheet for Patients: BloggerCourse.com  Fact Sheet for Healthcare Providers: SeriousBroker.it  This test is not yet approved or cleared by the [place] FDA and has been authorized for detection and/or diagnosis of SARS-CoV-2 by FDA under an Emergency Use Authorization (EUA). This EUA will remain in effect (meaning this test can be used) for the duration of the COVID-19 declaration under Section 564(b)(1) of the Act, 21 U.S.C. section 360bbb-3(b)(1), unless the authorization is terminated or revoked.  Performed at [facility], [address] [place], [place] [address]     No current facility-administered medications for this encounter.   Current Outpatient Medications  Medication Sig Dispense Refill   lisinopril (ZESTRIL) 30 MG tablet Take 1 tablet (30 mg total) by mouth daily. 7 tablet 0   QUEtiapine (SEROQUEL) 100 MG tablet Take 1 tablet (100 mg total) by mouth at bedtime. 7 tablet 0    Musculoskeletal: Strength & Muscle Tone: within normal limits Gait & Station: normal Patient leans: N/A Psychiatric Specialty Exam:  Presentation  General Appearance: Appropriate for Environment  Eye Contact:Good  Speech:Clear and Coherent  Speech  Volume:Normal  Handedness:Right   Mood and Affect  Mood:Euthymic  Affect:Congruent   Thought Process  Thought Processes:Coherent; Goal Directed  Descriptions of Associations:Intact  Orientation:Full (Time, Place and Person)  Thought Content:Logical  History of Schizophrenia/Schizoaffective disorder:No  Duration of Psychotic Symptoms:No data recorded Hallucinations:Hallucinations: None  Ideas of Reference:None  Suicidal Thoughts:Suicidal Thoughts: No  Homicidal Thoughts:Homicidal Thoughts: No   Sensorium  Memory:Immediate Good; Recent Good; Remote Good  Judgment:Intact  Insight:Present   Executive Functions  Concentration:Good  Attention Span:Good  Recall:Good  Fund of Knowledge:Good  Language:Good   Psychomotor Activity  Psychomotor Activity:Psychomotor Activity: Normal   Assets  Assets:Communication Skills; Desire for Improvement; Physical Health; Resilience; Social Support   Sleep  Sleep:Sleep: Good Number of Hours of Sleep: 8   Physical Exam: Physical Exam Review of Systems  Psychiatric/Behavioral:  Positive for depression and substance abuse (hx of, none today). Negative for hallucinations, memory loss and suicidal ideas. The patient is not nervous/anxious and does not have insomnia.   All other systems reviewed and are negative. Blood pressure (!) 145/104, pulse 92, temperature 98.3 F (36.8 C), temperature source Oral, resp. rate 18, SpO2 98 %. There is no height or weight on file to calculate BMI.  Treatment Plan Summary: 50 year old male presented to ED with depression and fear of using heroin prior to appointment with [facility] on Saturday. Patient endorses chronic low  mood since loss of brother. Today, expresses no plan or intent. Does not meet criteria for inpatient psychiatric hospitalization. Recommended to keep [facility] appointment and can also utilize resources from [facility]  Disposition: No evidence of imminent risk to self or others at  present.    Vanetta Mulders, NP 07/03/2021 1:34 PM

## 2021-07-03 NOTE — ED Notes (Signed)
Patient resting quietly in room. No noted distress or abnormal behaviors noted. Will continue 15 minute checks and observation by security camera for safety. 

## 2021-07-03 NOTE — ED Provider Notes (Signed)
Patient seen and cleared for discharge by psychiatry.  Discharged with plan to follow-up with [facility] and [facility].    Gilles Chiquito, MD 07/03/21 [phone]

## 2021-07-03 NOTE — ED Notes (Signed)
RN, multiple NTs and charge RN unable to locate pt clothing prior to discharge.   Pt will discharge home in burgundy scrubs per ED director.

## 2021-07-03 NOTE — ED Notes (Signed)
Pt refused VS  

## 2021-07-03 NOTE — ED Notes (Signed)
Pt is asleep. Will attempt to get vitals when pt wakes up.

## 2021-07-03 NOTE — ED Provider Notes (Signed)
Emergency Medicine Observation Re-evaluation Note  QUADARIUS HENTON is a 50 y.o. male, seen on rounds today.    Physical Exam  BP (!) 145/98 (BP Location: Left Arm)   Pulse 83   Temp 98.9 F (37.2 C) (Oral)   Resp 18   SpO2 99%  Physical Exam General: Patient resting comfortably in bed Lungs: Patient in no respiratory distress Psych: Patient not currently combative  ED Course / MDM  EKG:     Plan  Current plan is for psych evaluation and disposition  CORNELIOUS BARTOLUCCI is not under involuntary commitment.     Arnaldo Natal, MD 07/03/21 [phone]

## 2021-07-03 NOTE — ED Notes (Signed)
Pt will discharge home with a cab voucher.

## 2021-09-12 IMAGING — CR DG FOOT COMPLETE 3+V*L*
1 series · 3 of 3 positions shown · non-contrast
Comparison: None.

CLINICAL DATA: Pain and swelling

EXAM:
LEFT FOOT - COMPLETE 3+ VIEW

[Series 1: dg foot complete left · 0.14mm/px · 3 of 3 slices shown]
[im 1/3]
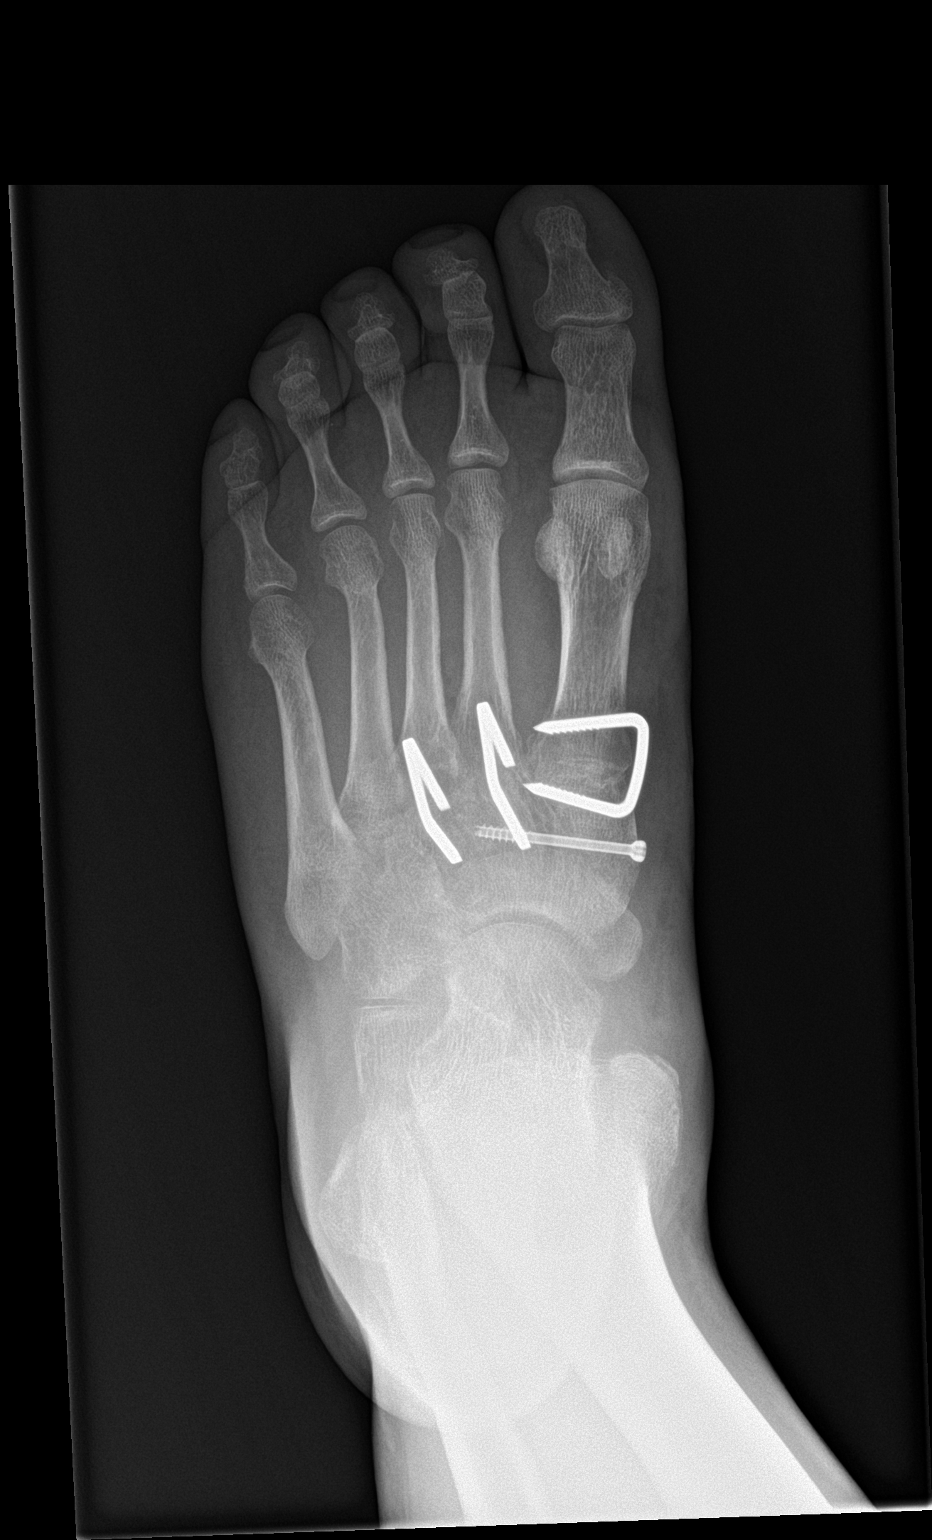
[im 2/3]
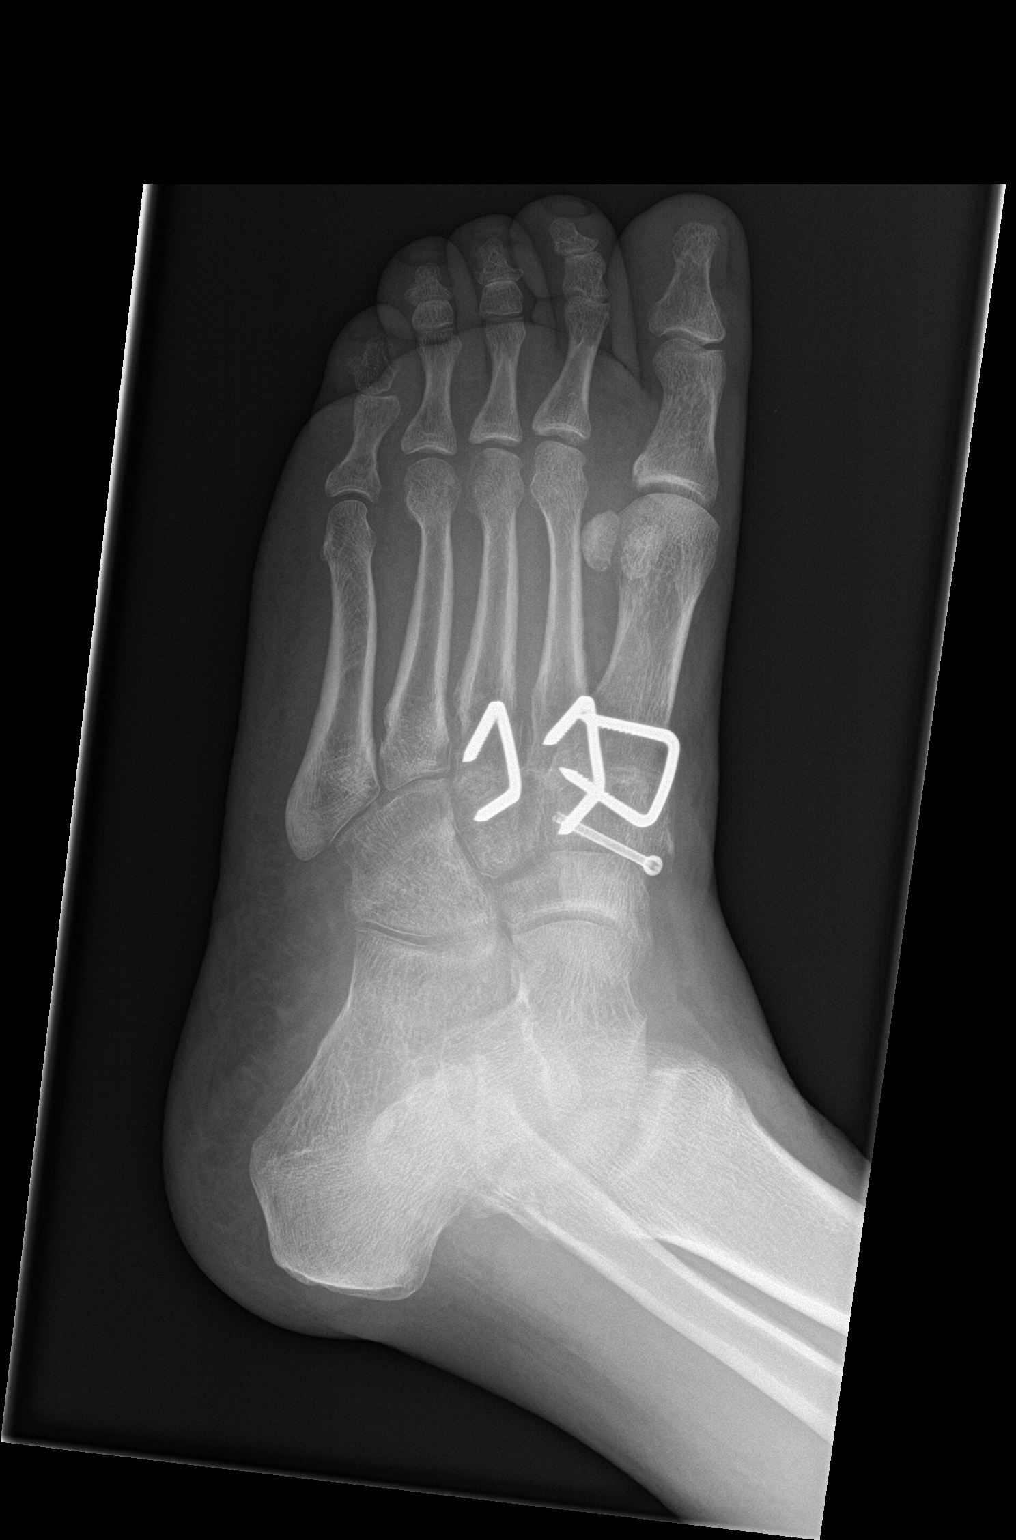
[im 3/3]
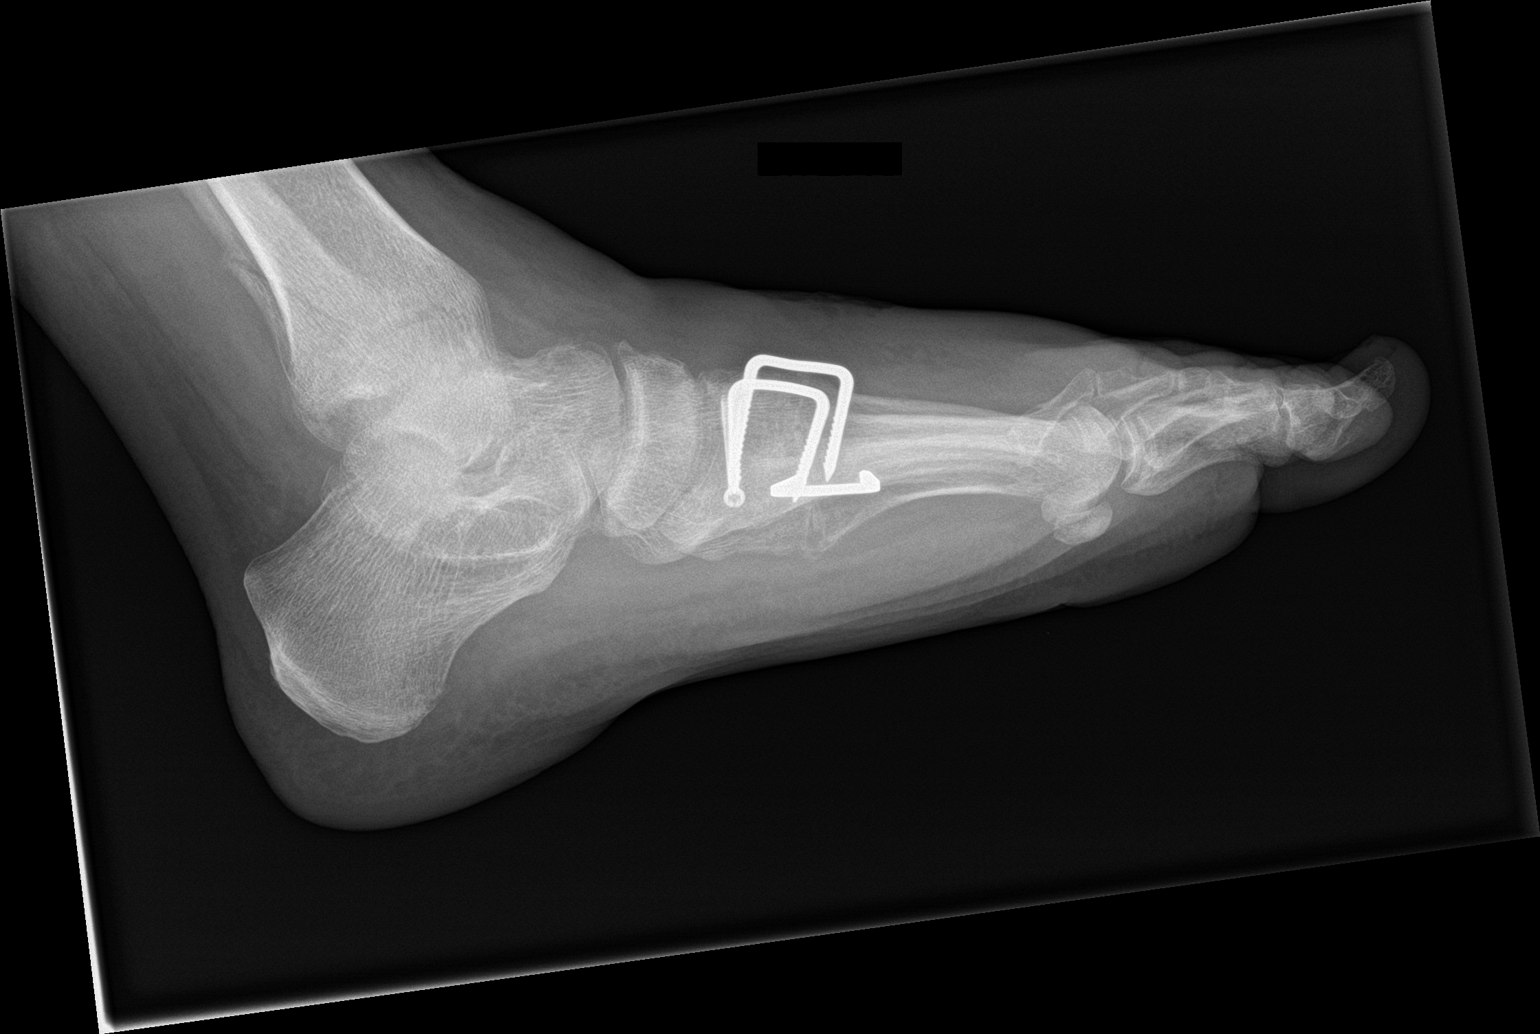

[3 of 3 positions shown; findings below may reference images not displayed]

FINDINGS: The patient is status post prior arthrodesis of the first through
third metatarsal bases. There is a transcortical screw coursing
through the medial cuneiform. There is no acute displaced fracture
or dislocation. There is disuse osteopenia. There is soft tissue
swelling about the foot with a questionable ulcer involving the
dorsal aspect of the midfoot overlying the hardware. There is no
definite radiographic evidence for osteomyelitis, however there is
lucency about the arthrodesis stable involving the third metatarsal.
There appears to be a healing fracture of the distal fibula, only
partially visualized on this study.
IMPRESSION: 1. Postsurgical changes of the midfoot. There is questionable
lucency about the staple involving the third metatarsal which may
indicate septic or aseptic loosening.
2. Soft tissue swelling about the foot with a questionable ulcer
involving the dorsal aspect of the foot at the level of the
orthopedic hardware.
3. Disuse osteopenia.
4. Partially visualized fracture involving the distal fibula.

## 2023-10-03 ENCOUNTER — Emergency Department: Payer: MEDICAID

## 2023-10-03 ENCOUNTER — Other Ambulatory Visit: Payer: Self-pay

## 2023-10-03 ENCOUNTER — Encounter: Payer: Self-pay | Admitting: Emergency Medicine

## 2023-10-03 ENCOUNTER — Emergency Department
Admission: EM | Admit: 2023-10-03 | Discharge: 2023-10-04 | Disposition: A | Discharge status: Home / Self Care | Payer: MEDICAID | Attending: Emergency Medicine | Admitting: Emergency Medicine

## 2023-10-03 DIAGNOSIS — W19XXXA Unspecified fall, initial encounter: Secondary | ICD-10-CM | POA: Diagnosis not present

## 2023-10-03 DIAGNOSIS — R519 Headache, unspecified: Secondary | ICD-10-CM | POA: Diagnosis not present

## 2023-10-03 DIAGNOSIS — R200 Anesthesia of skin: Secondary | ICD-10-CM | POA: Diagnosis not present

## 2023-10-03 DIAGNOSIS — R531 Weakness: Secondary | ICD-10-CM | POA: Diagnosis present

## 2023-10-03 LAB — COMPREHENSIVE METABOLIC PANEL
ALT: 12 U/L (ref 0–44)
AST: 23 U/L (ref 15–41)
Albumin: 4.3 g/dL (ref 3.5–5.0)
Alkaline Phosphatase: 93 U/L (ref 38–126)
Anion gap: 13 (ref 5–15)
BUN: 14 mg/dL (ref 6–20)
CO2: 25 mmol/L (ref 22–32)
Calcium: 9.9 mg/dL (ref 8.9–10.3)
Chloride: 102 mmol/L (ref 98–111)
Creatinine, Ser: 0.79 mg/dL (ref 0.61–1.24)
GFR, Estimated: >60 mL/min (ref >60)
Glucose, Bld: 201 mg/dL — ABNORMAL HIGH (ref 70–99)
Potassium: 3.8 mmol/L (ref 3.5–5.1)
Sodium: 140 mmol/L (ref 135–145)
Total Bilirubin: 0.5 mg/dL (ref 0.0–1.2)
Total Protein: 8.4 g/dL — ABNORMAL HIGH (ref 6.5–8.1)

## 2023-10-03 LAB — CBC
HCT: 42.4 % (ref 39.0–52.0)
Hemoglobin: 14.3 g/dL (ref 13.0–17.0)
MCH: 28.2 pg (ref 26.0–34.0)
MCHC: 33.7 g/dL (ref 30.0–36.0)
MCV: 83.6 fL (ref 80.0–100.0)
Platelets: 271 10*3/uL (ref 150–400)
RBC: 5.07 MIL/uL (ref 4.22–5.81)
RDW: 14.8 % (ref 11.5–15.5)
WBC: 9.4 10*3/uL (ref 4.0–10.5)
nRBC: 0 % (ref 0.0–0.2)

## 2023-10-03 LAB — URINALYSIS, ROUTINE W REFLEX MICROSCOPIC
Bacteria, UA: NONE SEEN
Bilirubin Urine: NEGATIVE
Glucose, UA: >=500 mg/dL — AB
Ketones, ur: NEGATIVE mg/dL
Leukocytes,Ua: NEGATIVE
Nitrite: NEGATIVE
Protein, ur: NEGATIVE mg/dL
Specific Gravity, Urine: 1.002 — ABNORMAL LOW (ref 1.005–1.030)
Squamous Epithelial / HPF: 0 /[HPF] (ref 0–5)
pH: 7 (ref 5.0–8.0)

## 2023-10-03 LAB — DIFFERENTIAL
Abs Immature Granulocytes: 0.05 10*3/uL (ref 0.00–0.07)
Basophils Absolute: 0 10*3/uL (ref 0.0–0.1)
Basophils Relative: 0 %
Eosinophils Absolute: 0.1 10*3/uL (ref 0.0–0.5)
Eosinophils Relative: 1 %
Immature Granulocytes: 1 %
Lymphocytes Relative: 17 %
Lymphs Abs: 1.6 10*3/uL (ref 0.7–4.0)
Monocytes Absolute: 0.6 10*3/uL (ref 0.1–1.0)
Monocytes Relative: 7 %
Neutro Abs: 7 10*3/uL (ref 1.7–7.7)
Neutrophils Relative %: 74 %

## 2023-10-03 LAB — URINE DRUG SCREEN, QUALITATIVE (ARMC ONLY)
Amphetamines, Ur Screen: NOT DETECTED
Barbiturates, Ur Screen: NOT DETECTED
Benzodiazepine, Ur Scrn: NOT DETECTED
Cannabinoid 50 Ng, Ur ~~LOC~~: NOT DETECTED
Cocaine Metabolite,Ur ~~LOC~~: NOT DETECTED
MDMA (Ecstasy)Ur Screen: NOT DETECTED
Methadone Scn, Ur: NOT DETECTED
Opiate, Ur Screen: NOT DETECTED
Phencyclidine (PCP) Ur S: NOT DETECTED
Tricyclic, Ur Screen: NOT DETECTED

## 2023-10-03 LAB — APTT: aPTT: 31 s (ref 24–36)

## 2023-10-03 LAB — ETHANOL: Alcohol, Ethyl (B): <10 mg/dL (ref <10)

## 2023-10-03 LAB — CBG MONITORING, ED: Glucose-Capillary: 216 mg/dL — ABNORMAL HIGH (ref 70–99)

## 2023-10-03 LAB — PROTIME-INR
INR: 1.1 (ref 0.8–1.2)
Prothrombin Time: 14.1 s (ref 11.4–15.2)

## 2023-10-03 MED ORDER — ACETAMINOPHEN 500 MG PO TABS
1000.0000 mg | ORAL_TABLET | Freq: Once | ORAL | Status: AC
  Administered 2023-10-03: 1000 mg via ORAL
  Filled 2023-10-03: qty 2

## 2023-10-03 MED ORDER — GADOBUTROL 1 MMOL/ML IV SOLN
7.0000 mL | Freq: Once | INTRAVENOUS | Status: AC | PRN
  Administered 2023-10-03: 7 mL via INTRAVENOUS

## 2023-10-03 NOTE — Progress Notes (Signed)
 2044 - Code Stroke Activated  mRS 0, LKWT 1900 2044 - Patient in CT at ealert 2053 - Patient returned to room 2049 - Tele-Neurologist paged  2052 - Dr. Graciela Husbands on stroke cart  2103 - Rad result receieved by RN. 2103 - CT head results reviewed by Dr. Graciela Husbands on camera TNK not given d/t recent head trauma

## 2023-10-03 NOTE — ED Triage Notes (Signed)
Pt arrived to room from CT.

## 2023-10-03 NOTE — ED Provider Notes (Signed)
 [facility] Provider Note    Event Date/Time   First MD Initiated Contact with Patient 10/03/23 2040     (approximate)   History   Code Stroke   HPI  Carlos Rubio is a 53 y.o. male who presents to the emergency department be as a code stroke.  Patient is coming from prison for left-sided weakness and numbness.  Patient states that he passed out yesterday.  Did land on his left side.  He then fell again today. He is complaining of headache. No history of strokes.    Physical Exam   Triage Vital Signs: ED Triage Vitals [10/03/23 2058]  Encounter Vitals Group     BP      Systolic BP Percentile      Diastolic BP Percentile      Pulse      Resp      Temp      Temp src      SpO2      Weight 162 lb 14.7 oz (73.9 kg)     Height      Head Circumference      Peak Flow      Pain Score 10     Pain Loc      Pain Education      Exclude from Growth Chart     Most recent vital signs: Vitals:   10/03/23 2120 10/03/23 2123  BP: (!) 148/88   Pulse: (!) 102   Resp: 18   Temp:  98.1 F (36.7 C)  SpO2: 98%     General: Awake, alert, oriented. CV:  Good peripheral perfusion. Regular rate and rhythm. Resp:  Normal effort.  Abd:  No distention.  Neuro:  Face symmetric. Strength 5/5 in right extremities, 4+/5 in left extremities. Subjective decreased sensation to the left side.   ED Results / Procedures / Treatments   Labs (all labs ordered are listed, but only abnormal results are displayed) Labs Reviewed  COMPREHENSIVE METABOLIC PANEL - Abnormal; Notable for the following components:      Result Value   Glucose, Bld 201 (*)    Total Protein 8.4 (*)    All other components within normal limits  URINALYSIS, ROUTINE W REFLEX MICROSCOPIC - Abnormal; Notable for the following components:   Color, Urine COLORLESS (*)    APPearance CLEAR (*)    Specific Gravity, Urine 1.002 (*)    Glucose, UA >=500 (*)    Hgb urine dipstick SMALL (*)    All other  components within normal limits  CBG MONITORING, ED - Abnormal; Notable for the following components:   Glucose-Capillary 216 (*)    All other components within normal limits  ETHANOL  PROTIME-INR  APTT  CBC  DIFFERENTIAL  URINE DRUG SCREEN, QUALITATIVE ([facility] ONLY)     EKG  I, Phineas Semen, attending physician, personally viewed and interpreted this EKG  EKG Time: 2121 Rate: 103 Rhythm: sinus tachycardia Axis: normal Intervals: qtc 451 QRS: narrow, q waves v1 ST changes: no st elevation Impression: abnormal ekg    RADIOLOGY I independently interpreted and visualized the CT head. My interpretation: No ICH Radiology interpretation:  IMPRESSION:  1. Negative head CT.  No acute intracranial abnormality.  2. ASPECTS is 10.   I, Phineas Semen, personally discussed these images (CT head) and results by phone with the on-call radiologist and used this discussion as part of my medical decision making.     PROCEDURES:  Critical Care performed: Yes  CRITICAL CARE Performed by: Phineas Semen   Total critical care time: 30 minutes  Critical care time was exclusive of separately billable procedures and treating other patients.  Critical care was necessary to treat or prevent imminent or life-threatening deterioration.  Critical care was time spent personally by me on the following activities: development of treatment plan with patient and/or surrogate as well as nursing, discussions with consultants, evaluation of patient's response to treatment, examination of patient, obtaining history from patient or surrogate, ordering and performing treatments and interventions, ordering and review of laboratory studies, ordering and review of radiographic studies, pulse oximetry and re-evaluation of patient's condition.   Procedures    MEDICATIONS ORDERED IN ED: Medications - No data to display   IMPRESSION / MDM / ASSESSMENT AND PLAN / ED COURSE  I reviewed the triage  vital signs and the nursing notes.                              Differential diagnosis includes, but is not limited to, CVA, TIA, peripheral nerve disorder, electrolyte abnormality, MSK injury.  Patient's presentation is most consistent with acute presentation with potential threat to life or bodily function.   The patient is on the cardiac monitor to evaluate for evidence of arrhythmia and/or significant heart rate changes.  Patient presented to the emergency department today as a code stroke for left-sided weakness and numbness.  CT head without any acute abnormality.  Patient was seen by Dr. Graciela Husbands with neurology. At this time did not feel patient was candidate for TNK. Did recommend MRI/MRA. If negative did not think patient would require admission from a neurologic standpoint.   Awaiting MRI results at time of sign out.     FINAL CLINICAL IMPRESSION(S) / ED DIAGNOSES   Final diagnoses:  Left-sided weakness      Note:  This document was prepared using Dragon voice recognition software and may include unintentional dictation errors.    Phineas Semen, MD 10/03/23 [phone]

## 2023-10-03 NOTE — ED Provider Notes (Signed)
-----------------------------------------   11:18 PM on 10/03/2023 -----------------------------------------  Assuming care from Dr. Derrill Kay.  In short, Carlos Rubio is a 53 y.o. male with a chief complaint of left sided weakness.  Refer to the original H&P for additional details.  The current plan of care is to follow up MRIs and reassess.   Clinical Course as of 10/04/23 0100  Mon Oct 04, 2023  0032 I viewed and interpreted the patient's MRIs and see no evidence of acute stroke.  I also reviewed the radiology reports and there is no evidence of acute CVA or obstruction on his angiograms and MRI brain.  Reaching out to Dr. Graciela Husbands with neurology to follow-up, but the previous plan was to discharge if the CVA workup was normal [CF]  0046 Consulted again with Dr. Graciela Husbands with neurology.  He reviewed the images and agrees that there are no acute neurological issues either with the vasculature or any evidence of CVA.  I reassessed the patient and he is feeling fine other than mild headache.  He has no appreciable neurological deficits at this time.  He will be discharged back to jail with recommendations for as needed Tylenol and a dose of Tylenol 1000 mg prior to discharge.  I gave my usual return precautions. [CF]    Clinical Course User Index [CF] Loleta Rose, MD     Medications  acetaminophen (TYLENOL) tablet 1,000 mg (1,000 mg Oral Given 10/03/23 2255)  gadobutrol (GADAVIST) 1 MMOL/ML injection 7 mL (7 mLs Intravenous Contrast Given 10/03/23 2300)     ED Discharge Orders     None      Final diagnoses:  Left-sided weakness     Loleta Rose, MD 10/04/23 0101

## 2023-10-03 NOTE — ED Triage Notes (Signed)
 Pt to ED [facility], with code stroke. Per EMS pt was found unresponsive at 2010, code stroke initiated. Per EMS pt with L sided weakness and L sided facial droop. Per [facility], they do 30 min rounds on inmates, and patient had just been given a meal tray, best estimate for [facility]WK 1930-2000, with symptoms discovered at 2010.  Pt reports HA since Sat morning, and has been having intermittent BP spikes. Pt reports syncopal episode 2/15, fell, and hit his head, was given Tylenol for pain. Pt states BP meds were switched to Lisinopril earlier today 2/16.

## 2023-10-03 NOTE — ED Notes (Signed)
Code stroke called in the field.

## 2023-10-03 NOTE — Consult Note (Addendum)
 [facility]   Patient Name:   Carlos Rubio, Carlos Rubio. Date of Birth:   [birth year] Identification Number:   MRN - [id number] Date of Service:   10/03/2023 20:49:32  Diagnosis:       I63.89 - Cerebrovascular accident (CVA) due to other mechanism [facility])  Impression:      53 y/o M, history of HTN, HLD, former tobacco use and heroine abuse, presenting from jail for episode of loss of consciousness, and then with left-sided weakness and numbness, LKN between 6:30 PM to 7:00 PM this evening. On examination, he does have drift of LUE and LLE, and reduced sensation over left side of face and left arm. He also seems to have pain associated with trying to raise his left arm and left leg, making me question whether patient could have landed on his left side and sustained some musculoskeletal injuries. NCHCT did not show acute abnormalities. Thrombolytic not advised since patient had reported also passing out last night and "busting the front" of his head, and head trauma in the past 3 months being CI to thrombolytic. CTA not pursued given iodine contrast allergy, and clinically low suspicion for LVO.    At this point, highest suspicion is for pain-related weakness on the left side, although stroke is on differential as well.    Would recommend MRI brain w/o contrast to more definitely evaluate for possibility of stroke. If negative for stroke, would then focus on supportive treatment for his pain, and ruling out any major arm or leg injuries, and from neurological standpoint could be discharged. Should MRI be positive for stroke, would then recommend initiation of ASA 325 mg x 1, and admission for further work-up.  Sign Out:       Discussed with Emergency Department Provider    ------------------------------------------------------------------------------  Advanced Imaging: Advanced Imaging Deferred because:  patient with iodine contrast  allergy   Metrics: Last Known Well: 10/03/2023 19:00:00 Dispatch Time: 10/03/2023 20:49:32 Arrival Time: 10/03/2023 20:38:00 Initial Response Time: 10/03/2023 20:52:53 Symptoms: fainting episode, left-sided weakness/numbness. Initial patient interaction: 10/03/2023 20:55:58 NIHSS Assessment Completed: 10/03/2023 21:03:18 Patient is not a candidate for Thrombolytic. Thrombolytic Medical Decision: 10/03/2023 21:09:25 Patient was not deemed candidate for Thrombolytic because of following reasons: Significant head trauma or stroke in previous 3 months .  I personally Reviewed the CT Head and it Showed no acute intracranial abnormalities  Primary Provider Notified of Diagnostic Impression and Management Plan on: 10/03/2023 21:24:03    ------------------------------------------------------------------------------  History of Present Illness: Patient is a 53 year old Male.  Patient was brought by EMS for symptoms of fainting episode, left-sided weakness/numbness. Patient is able to provide history. He is currently inmate in jail, last known normal was between 6:30 PM to 7:00 PM when he was eating dinner, next thing he realized is being on the ground, was found unresponsive reportedly. When he came to, noticed having left-sided weakness and numbness, with component of pain on the left side of his body as well. He also reports having bad headaches since this past Saturday which seem to be associated with taking his blood pressure medication, and last night also had fainting spell with associated head trauma.    Past Medical History:      Hypertension      Hyperlipidemia      There is no history of Diabetes Mellitus      There is no history of Atrial Fibrillation      There is no history of Stroke Other PMH:  GERD  Medications:  No Anticoagulant use  No Antiplatelet use Reviewed EMR for current medications  Allergies:  Reviewed  Social History: Current Employee : former heroine  use Smoking: Former Alcohol Use: No Drug Use: Former  Family History:  There is no family history of premature cerebrovascular disease pertinent to this consultation  ROS : 14 Points Review of Systems was performed and was negative except mentioned in HPI.  Past Surgical History: There Is No Surgical History Contributory To Today's Visit     Examination: BP ---  WUJWJ(191), Blood Glucose(216) 1A: Level of Consciousness - Alert; keenly responsive + 0 1B: Ask Month and Age - Both Questions Right + 0 1C: Blink Eyes & Squeeze Hands - Performs Both Tasks + 0 2: Test Horizontal Extraocular Movements - Normal + 0 3: Test Visual Fields - No Visual Loss + 0 4: Test Facial Palsy (Use Grimace if Obtunded) - Normal symmetry + 0 5A: Test Left Arm Motor Drift - Drift, but doesn't hit bed + 1 5B: Test Right Arm Motor Drift - No Drift for 10 Seconds + 0 6A: Test Left Leg Motor Drift - Drift, but doesn't hit bed + 1 6B: Test Right Leg Motor Drift - No Drift for 5 Seconds + 0 7: Test Limb Ataxia (FNF/Heel-Shin) - No Ataxia + 0 8: Test Sensation - Mild-Moderate Loss: Less Sharp/More Dull + 1 9: Test Language/Aphasia - Normal; No aphasia + 0 10: Test Dysarthria - Normal + 0 11: Test Extinction/Inattention - No abnormality + 0  NIHSS Score: 3  NIHSS Free Text : reduced sensation over left side of face and left arm  Pre-Morbid Modified Rankin Scale: 0 Points = No symptoms at all  Spoke with : Dr. Derrill Kay over Epic messaging system  This consult was conducted in real time using interactive audio and Immunologist. Patient was informed of the technology being used for this visit and agreed to proceed. Patient located in hospital and provider located at home/office setting.   Patient is being evaluated for possible acute neurologic impairment and high probability of imminent or life-threatening deterioration. I spent total of 37 minutes providing care to this patient, including time for face  to face visit via telemedicine, review of medical records, imaging studies and discussion of findings with providers, the patient and/or family.   Dr Peri Jefferson   [facility] For Inpatient follow-up with [facility] physician please call [facility] at [phone]. As we are not an outpatient service for any post hospital discharge needs please contact the hospital for assistance. If you have any questions for the [facility] physicians or need to reconsult for clinical or diagnostic changes please contact us via [facility] at [phone].

## 2023-10-04 MED ORDER — IBUPROFEN 600 MG PO TABS
600.0000 mg | ORAL_TABLET | Freq: Once | ORAL | Status: AC
  Administered 2023-10-04: 600 mg via ORAL
  Filled 2023-10-04: qty 1

## 2023-10-04 NOTE — Discharge Instructions (Signed)
 Your workup in the Emergency Department today was reassuring.  You are cleared to return to incarceration.  We did not find any specific abnormalities including on your MRIs, CT scan, and lab work.  We recommend you drink plenty of fluids, take your regular medications and/or any new ones prescribed today, and follow up with the doctor(s) listed in these documents as recommended.  For the medical providers at the jail: Please consider this as an order for acetaminophen 1000 mg by mouth every 6 hours as needed for moderate to severe headache for the next 3 days.  Return to the Emergency Department if you develop new or worsening symptoms that concern you.
